# Patient Record
Sex: Female | Born: 1985 | Race: Black or African American | Hispanic: No | Marital: Single | Smoking: Never smoker
Health system: Southern US, Community
[De-identification: ages and names within clinical notes are randomized; demographics above are authoritative.]

## PROBLEM LIST (undated history)

## (undated) DIAGNOSIS — M797 Fibromyalgia: Secondary | ICD-10-CM

## (undated) DIAGNOSIS — F32A Depression, unspecified: Secondary | ICD-10-CM

## (undated) DIAGNOSIS — Z227 Latent tuberculosis: Secondary | ICD-10-CM

## (undated) DIAGNOSIS — F329 Major depressive disorder, single episode, unspecified: Secondary | ICD-10-CM

## (undated) DIAGNOSIS — G932 Benign intracranial hypertension: Secondary | ICD-10-CM

## (undated) DIAGNOSIS — G35 Multiple sclerosis: Secondary | ICD-10-CM

## (undated) DIAGNOSIS — G43709 Chronic migraine without aura, not intractable, without status migrainosus: Secondary | ICD-10-CM

## (undated) DIAGNOSIS — G43909 Migraine, unspecified, not intractable, without status migrainosus: Secondary | ICD-10-CM

## (undated) DIAGNOSIS — W19XXXA Unspecified fall, initial encounter: Secondary | ICD-10-CM

## (undated) HISTORY — DX: Latent tuberculosis: Z22.7

## (undated) HISTORY — DX: Fibromyalgia: M79.7

## (undated) HISTORY — PX: OTHER SURGICAL HISTORY: SHX169

## (undated) HISTORY — DX: Migraine, unspecified, not intractable, without status migrainosus: G43.909

## (undated) HISTORY — DX: Unspecified fall, initial encounter: W19.XXXA

## (undated) HISTORY — DX: Chronic migraine without aura, not intractable, without status migrainosus: G43.709

## (undated) HISTORY — DX: Benign intracranial hypertension: G93.2

---

## 2014-02-15 ENCOUNTER — Encounter (HOSPITAL_COMMUNITY): Payer: Self-pay | Admitting: Emergency Medicine

## 2014-02-15 ENCOUNTER — Emergency Department (HOSPITAL_COMMUNITY)
Admission: EM | Admit: 2014-02-15 | Discharge: 2014-02-16 | Disposition: A | Discharge status: Home / Self Care | Payer: Medicare (Managed Care) | Attending: Emergency Medicine | Admitting: Emergency Medicine

## 2014-02-15 DIAGNOSIS — F1310 Sedative, hypnotic or anxiolytic abuse, uncomplicated: Secondary | ICD-10-CM | POA: Diagnosis not present

## 2014-02-15 DIAGNOSIS — R45851 Suicidal ideations: Secondary | ICD-10-CM | POA: Insufficient documentation

## 2014-02-15 DIAGNOSIS — Z3202 Encounter for pregnancy test, result negative: Secondary | ICD-10-CM | POA: Insufficient documentation

## 2014-02-15 DIAGNOSIS — F1210 Cannabis abuse, uncomplicated: Secondary | ICD-10-CM | POA: Insufficient documentation

## 2014-02-15 DIAGNOSIS — F3289 Other specified depressive episodes: Secondary | ICD-10-CM | POA: Insufficient documentation

## 2014-02-15 DIAGNOSIS — F32A Depression, unspecified: Secondary | ICD-10-CM

## 2014-02-15 DIAGNOSIS — Z8669 Personal history of other diseases of the nervous system and sense organs: Secondary | ICD-10-CM | POA: Insufficient documentation

## 2014-02-15 DIAGNOSIS — Z008 Encounter for other general examination: Secondary | ICD-10-CM | POA: Diagnosis present

## 2014-02-15 DIAGNOSIS — F329 Major depressive disorder, single episode, unspecified: Secondary | ICD-10-CM | POA: Diagnosis not present

## 2014-02-15 HISTORY — DX: Major depressive disorder, single episode, unspecified: F32.9

## 2014-02-15 HISTORY — DX: Multiple sclerosis: G35

## 2014-02-15 HISTORY — DX: Depression, unspecified: F32.A

## 2014-02-15 LAB — COMPREHENSIVE METABOLIC PANEL
ALBUMIN: 4 g/dL (ref 3.5–5.2)
ALK PHOS: 62 U/L (ref 39–117)
ALT: 16 U/L (ref 0–35)
AST: 24 U/L (ref 0–37)
Anion gap: 15 (ref 5–15)
BUN: 7 mg/dL (ref 6–23)
CHLORIDE: 101 meq/L (ref 96–112)
CO2: 25 mEq/L (ref 19–32)
Calcium: 9.3 mg/dL (ref 8.4–10.5)
Creatinine, Ser: 0.63 mg/dL (ref 0.50–1.10)
GFR calc Af Amer: >90 mL/min (ref >90)
GFR calc non Af Amer: >90 mL/min (ref >90)
Glucose, Bld: 92 mg/dL (ref 70–99)
POTASSIUM: 3.9 meq/L (ref 3.7–5.3)
Sodium: 141 mEq/L (ref 137–147)
TOTAL PROTEIN: 7.7 g/dL (ref 6.0–8.3)
Total Bilirubin: 0.3 mg/dL (ref 0.3–1.2)

## 2014-02-15 LAB — RAPID URINE DRUG SCREEN, HOSP PERFORMED
Amphetamines: NOT DETECTED
BENZODIAZEPINES: NOT DETECTED
Barbiturates: POSITIVE — AB
COCAINE: NOT DETECTED
Opiates: NOT DETECTED
TETRAHYDROCANNABINOL: POSITIVE — AB

## 2014-02-15 LAB — CBC
HEMATOCRIT: 38.7 % (ref 36.0–46.0)
Hemoglobin: 12.8 g/dL (ref 12.0–15.0)
MCH: 29.2 pg (ref 26.0–34.0)
MCHC: 33.1 g/dL (ref 30.0–36.0)
MCV: 88.2 fL (ref 78.0–100.0)
PLATELETS: 231 10*3/uL (ref 150–400)
RBC: 4.39 MIL/uL (ref 3.87–5.11)
RDW: 14 % (ref 11.5–15.5)
WBC: 8.6 10*3/uL (ref 4.0–10.5)

## 2014-02-15 LAB — ETHANOL

## 2014-02-15 LAB — SALICYLATE LEVEL: Salicylate Lvl: <2 mg/dL — ABNORMAL LOW (ref 2.8–20.0)

## 2014-02-15 LAB — PREGNANCY, URINE: Preg Test, Ur: NEGATIVE

## 2014-02-15 LAB — ACETAMINOPHEN LEVEL

## 2014-02-15 MED ORDER — VITAMIN B-6 100 MG PO TABS
100.0000 mg | ORAL_TABLET | Freq: Every day | ORAL | Status: DC
  Administered 2014-02-15: 100 mg via ORAL
  Filled 2014-02-15 (×2): qty 1

## 2014-02-15 MED ORDER — LORATADINE 10 MG PO TABS
10.0000 mg | ORAL_TABLET | Freq: Every day | ORAL | Status: DC
  Filled 2014-02-15: qty 1

## 2014-02-15 MED ORDER — ZOLPIDEM TARTRATE 5 MG PO TABS
5.0000 mg | ORAL_TABLET | Freq: Every day | ORAL | Status: DC
  Administered 2014-02-15: 5 mg via ORAL
  Filled 2014-02-15: qty 1

## 2014-02-15 MED ORDER — PRENATAL 27-0.8 MG PO TABS: 1.0000 | ORAL_TABLET | Freq: Every morning | ORAL | Status: DC

## 2014-02-15 MED ORDER — ISONIAZID 300 MG PO TABS
300.0000 mg | ORAL_TABLET | Freq: Every day | ORAL | Status: DC
  Administered 2014-02-15: 300 mg via ORAL
  Filled 2014-02-15 (×2): qty 1

## 2014-02-15 MED ORDER — DRONABINOL 5 MG PO CAPS: 5.0000 mg | ORAL_CAPSULE | Freq: Every day | ORAL | Status: DC

## 2014-02-15 MED ORDER — VITAMIN D3 25 MCG (1000 UNIT) PO TABS
2000.0000 [IU] | ORAL_TABLET | Freq: Every day | ORAL | Status: DC
  Administered 2014-02-15: 2000 [IU] via ORAL
  Filled 2014-02-15: qty 2

## 2014-02-15 MED ORDER — CITALOPRAM HYDROBROMIDE 10 MG PO TABS: 10.0000 mg | ORAL_TABLET | Freq: Every day | ORAL | Status: DC

## 2014-02-15 MED ORDER — DULOXETINE HCL 60 MG PO CPEP
60.0000 mg | ORAL_CAPSULE | Freq: Every day | ORAL | Status: DC
  Administered 2014-02-15: 60 mg via ORAL
  Filled 2014-02-15: qty 1

## 2014-02-15 MED ORDER — VITAMIN D 50 MCG (2000 UT) PO CAPS: 2000.0000 [IU] | ORAL_CAPSULE | Freq: Every day | ORAL | Status: DC

## 2014-02-15 MED ORDER — OXCARBAZEPINE 150 MG PO TABS
150.0000 mg | ORAL_TABLET | Freq: Every day | ORAL | Status: DC
  Administered 2014-02-15: 150 mg via ORAL
  Filled 2014-02-15 (×2): qty 1

## 2014-02-15 NOTE — ED Notes (Signed)
All of pts belongings sent with pt mother.

## 2014-02-15 NOTE — ED Notes (Signed)
Pt c/o SI without plan x 1 month; pt sts increased depression and life stress

## 2014-02-15 NOTE — ED Provider Notes (Signed)
CSN: [id number]     Arrival date & time 02/15/14  1503 History   First MD Initiated Contact with Patient 02/15/14 1623     Chief Complaint  Patient presents with  . Medical Clearance  . Suicidal     (Consider location/radiation/quality/duration/timing/severity/associated sxs/prior Treatment) HPI Comments: Patient presents to the emergency department with chief complaint of suicidal ideation. She states that she has a history of depression, MS, and fibromyalgia. She states that she was sent to the emergency department by her primary care provider for suicidal thoughts. She states that she has been having the thoughts for the past month. She denies a specific plan of how to kill herself. She denies any HI. Denies any recreational drug use. Denies any alcohol use. There no aggravating or relieving factors. Patient is concerned the thoughts are coming from a recent medication switched to Cymbalta.  The history is provided by the patient. No language interpreter was used.    Past Medical History  Diagnosis Date  . Depression   . Multiple sclerosis    History reviewed. No pertinent past surgical history. History reviewed. No pertinent family history. History  Substance Use Topics  . Smoking status: Never Smoker   . Smokeless tobacco: Not on file  . Alcohol Use: Yes     Comment: occ   OB History   Grav Para Term Preterm Abortions TAB SAB Ect Mult Living                 Review of Systems  Constitutional: Negative for fever and chills.  Respiratory: Negative for shortness of breath.   Cardiovascular: Negative for chest pain.  Gastrointestinal: Negative for nausea, vomiting, diarrhea and constipation.  Genitourinary: Negative for dysuria.  All other systems reviewed and are negative.     Allergies  Review of patient's allergies indicates not on file.  Home Medications   Prior to Admission medications   Not on File   BP 111/8  Pulse 90  Temp(Src) 97.8 F (36.6 C) (Oral)   Resp 18  SpO2 99% Physical Exam  Nursing note and vitals reviewed. Constitutional: She is oriented to person, place, and time. She appears well-developed and well-nourished.  HENT:  Head: Normocephalic and atraumatic.  Eyes: Conjunctivae and EOM are normal. Pupils are equal, round, and reactive to light.  Neck: Normal range of motion. Neck supple.  Cardiovascular: Normal rate and regular rhythm.  Exam reveals no gallop and no friction rub.   No murmur heard. Pulmonary/Chest: Effort normal and breath sounds normal. No respiratory distress. She has no wheezes. She has no rales. She exhibits no tenderness.  Abdominal: Soft. She exhibits no distension and no mass. There is no tenderness. There is no rebound and no guarding.  Musculoskeletal: Normal range of motion. She exhibits no edema and no tenderness.  Neurological: She is alert and oriented to person, place, and time.  Skin: Skin is warm and dry.  Psychiatric: She has a normal mood and affect. Her behavior is normal. Judgment and thought content normal.    ED Course  Procedures (including critical care time) Results for orders placed during the hospital encounter of 02/15/14  ACETAMINOPHEN LEVEL      Result Value Ref Range   Acetaminophen (Tylenol), Serum <15.0  10 - 30 ug/mL  CBC      Result Value Ref Range   WBC 8.6  4.0 - 10.5 K/uL   RBC 4.39  3.87 - 5.11 MIL/uL   Hemoglobin 12.8  12.0 - 15.0 g/dL  HCT 38.7  36.0 - 46.0 %   MCV 88.2  78.0 - 100.0 fL   MCH 29.2  26.0 - 34.0 pg   MCHC 33.1  30.0 - 36.0 g/dL   RDW 16.1  09.6 - 04.5 %   Platelets 231  150 - 400 K/uL  COMPREHENSIVE METABOLIC PANEL      Result Value Ref Range   Sodium 141  137 - 147 mEq/L   Potassium 3.9  3.7 - 5.3 mEq/L   Chloride 101  96 - 112 mEq/L   CO2 25  19 - 32 mEq/L   Glucose, Bld 92  70 - 99 mg/dL   BUN 7  6 - 23 mg/dL   Creatinine, Ser 4.09  0.50 - 1.10 mg/dL   Calcium 9.3  8.4 - 81.1 mg/dL   Total Protein 7.7  6.0 - 8.3 g/dL   Albumin 4.0   3.5 - 5.2 g/dL   AST 24  0 - 37 U/L   ALT 16  0 - 35 U/L   Alkaline Phosphatase 62  39 - 117 U/L   Total Bilirubin 0.3  0.3 - 1.2 mg/dL   GFR calc non Af Amer >90  >90 mL/min   GFR calc Af Amer >90  >90 mL/min   Anion gap 15  5 - 15  ETHANOL      Result Value Ref Range   Alcohol, Ethyl (B) <11  0 - 11 mg/dL  SALICYLATE LEVEL      Result Value Ref Range   Salicylate Lvl <2.0 (*) 2.8 - 20.0 mg/dL  URINE RAPID DRUG SCREEN (HOSP PERFORMED)      Result Value Ref Range   Opiates NONE DETECTED  NONE DETECTED   Cocaine NONE DETECTED  NONE DETECTED   Benzodiazepines NONE DETECTED  NONE DETECTED   Amphetamines NONE DETECTED  NONE DETECTED   Tetrahydrocannabinol POSITIVE (*) NONE DETECTED   Barbiturates POSITIVE (*) NONE DETECTED  PREGNANCY, URINE      Result Value Ref Range   Preg Test, Ur NEGATIVE  NEGATIVE   No results found.    EKG Interpretation None      MDM   Final diagnoses:  None    Patient with suicidal ideation. No clear plan today. TTS consult pending.  Will reassess after labs.  Medically clear pending normal labs.  Medically clear.  Filed Vitals:   02/15/14 1918  BP: 118/77  Pulse: 87  Temp: 98.3 F (36.8 C)  Resp: [address] [place] 02/15/14 2331

## 2014-02-15 NOTE — ED Notes (Signed)
Pt came to room with mom and did not freely answer questions when being assessed by RN.  Mother states she was recently diagnosed with MS, has cronic pain, recently had to leave her home in [place] and come to live with her mom due to not being able to adequately care for her 26 year old daughter and herself.  Pt mom states she told her she was depressed and having "bad thoughts" and admitting to mom she thought of hurting herself (no plan.)

## 2014-02-16 NOTE — Discharge Instructions (Signed)
Depression Frances Dudley, you were seen for depression.  You were given resources by our psychiatry team to follow up with.  If any of your symptoms worsen, come back to the ED immediately for repeat evaluation.  Thank you. Depression is feeling sad, low, down in the dumps, blue, gloomy, or empty. In general, there are two kinds of depression:  Normal sadness or grief. This can happen after something upsetting. It often goes away on its own within 2 weeks. After losing a loved one (bereavement), normal sadness and grief may last longer than two weeks. It usually gets better with time.  Clinical depression. This kind lasts longer than normal sadness or grief. It keeps you from doing the things you normally do in life. It is often hard to function at home, work, or at school. It may affect your relationships with others. Treatment is often needed. GET HELP RIGHT AWAY IF:  You have thoughts about hurting yourself or others.  You lose touch with reality (psychotic symptoms). You may:  See or hear things that are not real.  Have untrue beliefs about your life or people around you.  Your medicine is giving you problems. MAKE SURE YOU:  Understand these instructions.  Will watch your condition.  Will get help right away if you are not doing well or get worse. Document Released: 06/16/2010 Document Revised: 09/28/2013 Document Reviewed: 09/13/2011 [facility] Patient Information 2015 [place], [place]. This information is not intended to replace advice given to you by your health care provider. Make sure you discuss any questions you have with your health care provider.

## 2014-02-16 NOTE — BH Assessment (Addendum)
Tele Assessment Note   Frances Dudley is an 28 y.o. female.  -Clinician talked to Frances Horseman, PA at [facility].  He said that Dr. Hollace Dudley (PCP) had recommended that she come to [name] because of some SI.  Patient says that she has some SI.  A few days ago she had thoughts about wanting to step into traffic after her car broke down.  Pt says that the SI is "on and off."  Patient has no previous attempt to kill self.  Pt denies HI or A/V hallucinations.  Pt takes celexa which is prescribed by primary care physician and cymbalta is prescribed by neurologist.  Cymbalta was recently adjusted.    Pt was diagnosed with MS in April of this year.  Fibromyalgia was dx/ed in February or so.  Patient also recently moved from [place] after bringing her Frances Dudley (28 years old) to move in with her parents.  She had been upset about her car breaking down when she had thoughts about wanting to kill herself by stepping into traffic.  Patient does have depression and does want some help.  Patient said that she is able to contract for safety.  She is interested in information on the [facility] intensive outpatient care.  -Clinician talked to Frances Sievert, PA regarding patient care.  He said that if patient is able to contract for safety then she could be given outpatient referrals.  Patient care discussed also with Dr. Mora Dudley who was in agreement with patient signing no harm contract and getting referral list.  Clinician did send a no harm contract, information on intensive outpatient, and referral list to nurse in pod C.  Pt will be discharged home with referrals.  Axis I: Depressive Disorder NOS Axis II: Deferred Axis III:  Past Medical History  Diagnosis Date  . Depression   . Multiple sclerosis    Axis IV: other psychosocial or environmental problems Axis V: 41-50 serious symptoms  Past Medical History:  Past Medical History  Diagnosis Date  . Depression   . Multiple sclerosis     History reviewed. No  pertinent past surgical history.  Family History: History reviewed. No pertinent family history.  Social History:  reports that she has never smoked. She does not have any smokeless tobacco history on file. She reports that she drinks alcohol. She reports that she does not use illicit drugs.  Additional Social History:  Alcohol / Drug Use Pain Medications: See PTA medication list.  Marinol for pain relief. Prescriptions: See PTA medication list  Over the Counter: See PTA medication list. History of alcohol / drug use?: No history of alcohol / drug abuse  CIWA: CIWA-Ar BP: 118/77 mmHg Pulse Rate: 87 COWS:    PATIENT STRENGTHS: (choose at least two) Ability for insight Supportive family/friends  Allergies:  Allergies  Allergen Reactions  . Sumatriptan Nausea And Vomiting and Other (See Comments)    lockjaw    Home Medications:  (Not in a hospital admission)  OB/GYN Status:  No LMP recorded.  General Assessment Data Location of Assessment: [facility] ED Is this a Tele or Face-to-Face Assessment?: Face-to-Face Is this an Initial Assessment or a Re-assessment for this encounter?: Initial Assessment Living Arrangements: Parent (Pt lives with mother, her Frances Dudley and her grandparents) Can pt return to current living arrangement?: Yes Admission Status: Voluntary Is patient capable of signing voluntary admission?: Yes Transfer from: Acute Hospital Referral Source: MD (Dr. 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  Risk to self with the past 6 months Suicidal Ideation: Yes-Currently Present Suicidal Intent: Yes-Currently Present Is patient at risk for suicide?: Yes Suicidal Plan?: No  (Recently had thoughts of stepping into traffic.) Access to Means: Yes Specify Access to Suicidal Means: Traffic What has been your use of drugs/alcohol within the last 12 months?: None Previous Attempts/Gestures: No How many times?: 0 Other Self Harm Risks: N/A Triggers for Past Attempts: None known Intentional Self Injurious Behavior: None Family Suicide History: Yes (Brother committed suicide 2 years ago.) Recent stressful life event(s): Job Loss;Turmoil (Comment);Recent negative physical changes (Pt has MS, fibromyalgia.  Recent move, Health is poor.) Persecutory voices/beliefs?: Yes Depression: Yes Depression Symptoms: Despondent;Insomnia;Tearfulness;Isolating;Guilt;Loss of interest in usual pleasures;Feeling worthless/self pity Substance abuse history and/or treatment for substance abuse?: No Suicide prevention information given to non-admitted patients: Not applicable  Risk to Others within the past 6 months Homicidal Ideation: No Thoughts of Harm to Others: No Current Homicidal Intent: No Current Homicidal Plan: No Access to Homicidal Means: No Identified Victim: No one History of harm to others?: No Assessment of Violence: None Noted Violent Behavior Description: None noted Does patient have access to weapons?: No Criminal Charges Pending?: No Does patient have a court date: No  Psychosis Hallucinations: None noted Delusions: None noted  Mental Status Report Appear/Hygiene: Unremarkable;In scrubs Eye Contact: Fair Motor Activity: Freedom of movement;Unremarkable Speech: Logical/coherent Level of Consciousness: Alert Mood: Depressed;Empty;Helpless;Sad Affect: Appropriate to circumstance;Sad Anxiety Level: Minimal Thought Processes: Coherent;Relevant Judgement: Unimpaired Orientation: Person;Place;Situation Obsessive Compulsive Thoughts/Behaviors: Minimal  Cognitive Functioning Concentration: Decreased Memory: Recent Impaired;Remote Intact IQ:  Average Insight: Fair Impulse Control: Fair Appetite: Poor Weight Loss: 0 Weight Gain: 0 Sleep: Decreased Total Hours of Sleep:  (<4H/D) Vegetative Symptoms: Decreased grooming;Staying in bed  ADLScreening [facility] Assessment Services) Patient's cognitive ability adequate to safely complete daily activities?: Yes Patient able to express need for assistance with ADLs?: Yes Independently performs ADLs?: Yes (appropriate for developmental age)  Prior Inpatient Therapy Prior Inpatient Therapy: No Prior Therapy Dates: N/A Prior Therapy Facilty/Provider(s): N/A Reason for Treatment: N/A  Prior Outpatient Therapy Prior Outpatient Therapy: No Prior Therapy Dates: N/A Prior Therapy Facilty/Provider(s): N/A Reason for Treatment: N/A  ADL Screening (condition at time of admission) Patient's cognitive ability adequate to safely complete daily activities?: Yes Is the patient deaf or have difficulty hearing?: No Does the patient have difficulty seeing, even when wearing glasses/contacts?: Yes (Pt states that vision is getting worse.  She has contacts.) Does the patient have difficulty concentrating, remembering, or making decisions?: Yes Patient able to express need for assistance with ADLs?: Yes Does the patient have difficulty dressing or bathing?: No Independently performs ADLs?: Yes (appropriate for developmental age) Does the patient have difficulty walking or climbing stairs?: Yes (Pt says legs will give out or feel weak.) Weakness of Legs: Both Weakness of Arms/Hands: Both (Arms will feel achy or weak at times.)       Abuse/Neglect Assessment (Assessment to be complete while patient is alone) Physical Abuse: Denies Verbal Abuse: Denies Sexual Abuse: Denies Exploitation of patient/patient's resources: Denies Self-Neglect: Denies Values / Beliefs Cultural Requests During Hospitalization: None Spiritual Requests During Hospitalization: None   Advance Directives (For  Healthcare) Does patient have an advance directive?: No Would patient like information on creating an  advanced directive?: No - patient declined information    Additional Information 1:1 In Past 12 Months?: No CIRT Risk: No Elopement Risk: No Does patient have medical clearance?: Yes     Disposition:  Disposition Initial Assessment Completed for this Encounter: Yes Disposition of Patient: Inpatient treatment program;Referred to Type of inpatient treatment program: Adult Patient referred to: Other (Comment) (Pt to be reviewed by Karleen Hampshire.)  Beatriz Stallion Ray 02/16/2014 1:01 AM

## 2014-02-20 NOTE — ED Provider Notes (Signed)
Medical screening examination/treatment/procedure(s) were performed by non-physician practitioner and as supervising physician I was immediately available for consultation/collaboration.   EKG Interpretation None        Mirian Mo, MD 02/20/14 [id number]

## 2014-04-06 ENCOUNTER — Ambulatory Visit (INDEPENDENT_AMBULATORY_CARE_PROVIDER_SITE_OTHER): Payer: Medicaid Other | Admitting: Family Medicine

## 2014-04-06 ENCOUNTER — Telehealth: Payer: Self-pay | Admitting: Family Medicine

## 2014-04-06 VITALS — BP 100/63 | HR 71 | Temp 97.8°F | Resp 16 | Ht 62.0 in | Wt 212.0 lb

## 2014-04-06 DIAGNOSIS — R5383 Other fatigue: Secondary | ICD-10-CM

## 2014-04-06 DIAGNOSIS — R42 Dizziness and giddiness: Secondary | ICD-10-CM | POA: Diagnosis not present

## 2014-04-06 DIAGNOSIS — F418 Other specified anxiety disorders: Secondary | ICD-10-CM

## 2014-04-06 DIAGNOSIS — R52 Pain, unspecified: Secondary | ICD-10-CM | POA: Insufficient documentation

## 2014-04-06 DIAGNOSIS — Z227 Latent tuberculosis: Secondary | ICD-10-CM | POA: Insufficient documentation

## 2014-04-06 DIAGNOSIS — F32A Depression, unspecified: Secondary | ICD-10-CM

## 2014-04-06 DIAGNOSIS — Z91048 Other nonmedicinal substance allergy status: Secondary | ICD-10-CM | POA: Diagnosis not present

## 2014-04-06 DIAGNOSIS — R635 Abnormal weight gain: Secondary | ICD-10-CM

## 2014-04-06 DIAGNOSIS — R7611 Nonspecific reaction to tuberculin skin test without active tuberculosis: Secondary | ICD-10-CM

## 2014-04-06 DIAGNOSIS — F419 Anxiety disorder, unspecified: Secondary | ICD-10-CM

## 2014-04-06 DIAGNOSIS — R110 Nausea: Secondary | ICD-10-CM | POA: Insufficient documentation

## 2014-04-06 DIAGNOSIS — E559 Vitamin D deficiency, unspecified: Secondary | ICD-10-CM

## 2014-04-06 DIAGNOSIS — Z9109 Other allergy status, other than to drugs and biological substances: Secondary | ICD-10-CM | POA: Insufficient documentation

## 2014-04-06 DIAGNOSIS — F329 Major depressive disorder, single episode, unspecified: Secondary | ICD-10-CM

## 2014-04-06 DIAGNOSIS — Z8669 Personal history of other diseases of the nervous system and sense organs: Secondary | ICD-10-CM

## 2014-04-06 DIAGNOSIS — G35 Multiple sclerosis: Secondary | ICD-10-CM | POA: Diagnosis not present

## 2014-04-06 DIAGNOSIS — G4700 Insomnia, unspecified: Secondary | ICD-10-CM

## 2014-04-06 DIAGNOSIS — R55 Syncope and collapse: Secondary | ICD-10-CM | POA: Diagnosis not present

## 2014-04-06 LAB — CBC WITH DIFFERENTIAL/PLATELET
BASOS PCT: 1 % (ref 0–1)
Basophils Absolute: 0.1 10*3/uL (ref 0.0–0.1)
EOS ABS: 0.2 10*3/uL (ref 0.0–0.7)
EOS PCT: 3 % (ref 0–5)
HEMATOCRIT: 39.3 % (ref 36.0–46.0)
HEMOGLOBIN: 13 g/dL (ref 12.0–15.0)
Lymphocytes Relative: 50 % — ABNORMAL HIGH (ref 12–46)
Lymphs Abs: 4.1 10*3/uL — ABNORMAL HIGH (ref 0.7–4.0)
MCH: 28.3 pg (ref 26.0–34.0)
MCHC: 33.1 g/dL (ref 30.0–36.0)
MCV: 85.6 fL (ref 78.0–100.0)
MONOS PCT: 8 % (ref 3–12)
Monocytes Absolute: 0.6 10*3/uL (ref 0.1–1.0)
Neutro Abs: 3.1 10*3/uL (ref 1.7–7.7)
Neutrophils Relative %: 38 % — ABNORMAL LOW (ref 43–77)
Platelets: 270 10*3/uL (ref 150–400)
RBC: 4.59 MIL/uL (ref 3.87–5.11)
RDW: 15 % (ref 11.5–15.5)
WBC: 8.1 10*3/uL (ref 4.0–10.5)

## 2014-04-06 LAB — COMPLETE METABOLIC PANEL WITH GFR
ALT: 12 U/L (ref 0–35)
AST: 19 U/L (ref 0–37)
Albumin: 4.2 g/dL (ref 3.5–5.2)
Alkaline Phosphatase: 52 U/L (ref 39–117)
BILIRUBIN TOTAL: 0.4 mg/dL (ref 0.2–1.2)
BUN: 8 mg/dL (ref 6–23)
CALCIUM: 9.4 mg/dL (ref 8.4–10.5)
CO2: 26 mEq/L (ref 19–32)
CREATININE: 0.68 mg/dL (ref 0.50–1.10)
Chloride: 101 mEq/L (ref 96–112)
GFR, Est Non African American: >89 mL/min
Glucose, Bld: 83 mg/dL (ref 70–99)
Potassium: 3.8 mEq/L (ref 3.5–5.3)
Sodium: 138 mEq/L (ref 135–145)
Total Protein: 6.9 g/dL (ref 6.0–8.3)

## 2014-04-06 LAB — TSH: TSH: 1.3 u[IU]/mL (ref 0.350–4.500)

## 2014-04-06 MED ORDER — ZOLPIDEM TARTRATE 5 MG PO TABS: 5.0000 mg | ORAL_TABLET | Freq: Every day | ORAL | Status: DC

## 2014-04-06 MED ORDER — LORATADINE 10 MG PO TABS: 10.0000 mg | ORAL_TABLET | Freq: Every day | ORAL | Status: AC

## 2014-04-06 NOTE — Patient Instructions (Addendum)
Near-Syncope Near-syncope (commonly known as near fainting) is sudden weakness, dizziness, or feeling like you might pass out. During an episode of near-syncope, you may also develop pale skin, have tunnel vision, or feel sick to your stomach (nauseous). Near-syncope may occur when getting up after sitting or while standing for a long time. It is caused by a sudden decrease in blood flow to the brain. This decrease can result from various causes or triggers, most of which are not serious. However, because near-syncope can sometimes be a sign of something serious, a medical evaluation is required. The specific cause is often not determined. HOME CARE INSTRUCTIONS  Monitor your condition for any changes. The following actions may help to alleviate any discomfort you are experiencing:  Have someone stay with you until you feel stable.  Lie down right away and prop your feet up if you start feeling like you might faint. Breathe deeply and steadily. Wait until all the symptoms have passed. Most of these episodes last only a few minutes. You may feel tired for several hours.   Drink enough fluids to keep your urine clear or pale yellow.   If you are taking blood pressure or heart medicine, get up slowly when seated or lying down. Take several minutes to sit and then stand. This can reduce dizziness.  Follow up with your health care provider as directed. SEEK IMMEDIATE MEDICAL CARE IF:   You have a severe headache.   You have unusual pain in the chest, abdomen, or back.   You are bleeding from the mouth or rectum, or you have black or tarry stool.   You have an irregular or very fast heartbeat.   You have repeated fainting or have seizure-like jerking during an episode.   You faint when sitting or lying down.   You have confusion.   You have difficulty walking.   You have severe weakness.   You have vision problems.  MAKE SURE YOU:   Understand these instructions.  Will  watch your condition.  Will get help right away if you are not doing well or get worse. Document Released: 05/14/2005 Document Revised: 05/19/2013 Document Reviewed: 10/17/2012 [facility] Patient Information 2015 [place]. This information is not intended to replace advice given to you by your health care provider. Make sure you discuss any questions you have with your health care provider. Insomnia Insomnia is frequent trouble falling and/or staying asleep. Insomnia can be a long term problem or a short term problem. Both are common. Insomnia can be a short term problem when the wakefulness is related to a certain stress or worry. Long term insomnia is often related to ongoing stress during waking hours and/or poor sleeping habits. Overtime, sleep deprivation itself can make the problem worse. Every little thing feels more severe because you are overtired and your ability to cope is decreased. CAUSES   Stress, anxiety, and depression.  Poor sleeping habits.  Distractions such as TV in the bedroom.  Naps close to bedtime.  Engaging in emotionally charged conversations before bed.  Technical reading before sleep.  Alcohol and other sedatives. They may make the problem worse. They can hurt normal sleep patterns and normal dream activity.  Stimulants such as caffeine for several hours prior to bedtime.  Pain syndromes and shortness of breath can cause insomnia.  Exercise late at night.  Changing time zones may cause sleeping problems (jet lag). It is sometimes helpful to have someone observe your sleeping patterns. They should look for periods  of not breathing during the night (sleep apnea). They should also look to see how long those periods last. If you live alone or observers are uncertain, you can also be observed at a sleep clinic where your sleep patterns will be professionally monitored. Sleep apnea requires a checkup and treatment. Give your caregivers your medical history.  Give your caregivers observations your family has made about your sleep.  SYMPTOMS   Not feeling rested in the morning.  Anxiety and restlessness at bedtime.  Difficulty falling and staying asleep. TREATMENT   Your caregiver may prescribe treatment for an underlying medical disorders. Your caregiver can give advice or help if you are using alcohol or other drugs for self-medication. Treatment of underlying problems will usually eliminate insomnia problems.  Medications can be prescribed for short time use. They are generally not recommended for lengthy use.  Over-the-counter sleep medicines are not recommended for lengthy use. They can be habit forming.  You can promote easier sleeping by making lifestyle changes such as:  Using relaxation techniques that help with breathing and reduce muscle tension.  Exercising earlier in the day.  Changing your diet and the time of your last meal. No night time snacks.  Establish a regular time to go to bed.  Counseling can help with stressful problems and worry.  Soothing music and white noise may be helpful if there are background noises you cannot remove.  Stop tedious detailed work at least one hour before bedtime. HOME CARE INSTRUCTIONS   Keep a diary. Inform your caregiver about your progress. This includes any medication side effects. See your caregiver regularly. Take note of:  Times when you are asleep.  Times when you are awake during the night.  The quality of your sleep.  How you feel the next day. This information will help your caregiver care for you.  Get out of bed if you are still awake after 15 minutes. Read or do some quiet activity. Keep the lights down. Wait until you feel sleepy and go back to bed.  Keep regular sleeping and waking hours. Avoid naps.  Exercise regularly.  Avoid distractions at bedtime. Distractions include watching television or engaging in any intense or detailed activity like attempting to  balance the household checkbook.  Develop a bedtime ritual. Keep a familiar routine of bathing, brushing your teeth, climbing into bed at the same time each night, listening to soothing music. Routines increase the success of falling to sleep faster.  Use relaxation techniques. This can be using breathing and muscle tension release routines. It can also include visualizing peaceful scenes. You can also help control troubling or intruding thoughts by keeping your mind occupied with boring or repetitive thoughts like the old concept of counting sheep. You can make it more creative like imagining planting one beautiful flower after another in your backyard garden.  During your day, work to eliminate stress. When this is not possible use some of the previous suggestions to help reduce the anxiety that accompanies stressful situations. MAKE SURE YOU:   Understand these instructions.  Will watch your condition.  Will get help right away if you are not doing well or get worse. Document Released: 05/11/2000 Document Revised: 08/06/2011 Document Reviewed: 06/11/2007 [facility] Patient Information 2015 [place], [place]. This information is not intended to replace advice given to you by your health care provider. Make sure you discuss any questions you have with your health care provider.

## 2014-04-06 NOTE — Telephone Encounter (Signed)
Called the [facility] [place] on 04/06/2014 in regards to re-starting Tysabri therapy for multiple sclerosis. Faxed a request for medical records to [phone]. I was unable to speak with Dr. Rocky MorelBen Thrower or his nurse pertaining to current dose of Tysabri.

## 2014-04-06 NOTE — Progress Notes (Signed)
Subjective:    Patient ID: Frances Dudley, female    DOB: 04/23/1986, 28 y.o.   MRN: [id number]  HPI  Ms. Ericka PontiffMontgomery is a 28 year old female with a history of multiple sclerosis and depression that presents to establish care accompanied by her mother. She states that she recently relocated back to [place] from [place]. While in [place], she was followed by Dr. Rocky MorelBen Thrower at the [facility] for MS. Patient was also followed by pain management for generalized pain. Symptoms  of MS have been wide spread pain, syncope, dizziness, numbness to extremities, and frequent migraine headaches.  Onset of symptoms was gradual, starting about 8 months ago. Symptoms are currently of generalized severity. Symptoms occur all day.  Patient complains of depression. She complains of depressed mood, difficulty concentrating, fatigue, impaired memory, insomnia and weight gain. Onset was approximately 8 months ago, clinical course has worsened since that time. She denies current suicidal and homicidal plan or intent. She reports that she is adopted, so she is unsure whether there is a family history of depression. She reports that she was recently evaluated in the [facility] ED for suicidal thoughts. She reports that she had thoughts of walking in traffic. Symptoms have improved since discontining Cymbalta. She has been started on Celexa, which is working well per patient.    Past Medical History  Diagnosis Date  . Depression   . Multiple sclerosis    Review of Systems  Constitutional: Positive for fatigue and unexpected weight change (weight gain).  HENT: Negative.   Eyes: Negative.   Respiratory: Negative.   Cardiovascular: Negative.   Gastrointestinal: Negative.   Endocrine: Negative.   Genitourinary: Negative.   Musculoskeletal: Negative.   Skin: Negative.   Allergic/Immunologic: Positive for environmental allergies.  Neurological: Positive for dizziness, weakness and numbness.   Hematological: Negative.   Psychiatric/Behavioral: Positive for sleep disturbance. Negative for suicidal ideas. The patient is nervous/anxious.        Objective:   Physical Exam  Constitutional: She appears well-developed and well-nourished.  HENT:  Head: Normocephalic and atraumatic.  Right Ear: External ear normal.  Left Ear: External ear normal.  Mouth/Throat: Oropharynx is clear and moist.  Eyes: Conjunctivae and EOM are normal. Pupils are equal, round, and reactive to light. Right eye exhibits no nystagmus.  Neck: Trachea normal and normal range of motion. Neck supple. No thyroid mass and no thyromegaly present.  Cardiovascular: Normal rate, regular rhythm, normal heart sounds and normal pulses.   Pulmonary/Chest: Effort normal and breath sounds normal.  Abdominal: Soft. Normal appearance. There is no tenderness.  Lymphadenopathy:       Head (right side): No submental and no submandibular adenopathy present.       Head (left side): No submental and no submandibular adenopathy present.  Neurological: She is alert. She displays no atrophy and no tremor. No cranial nerve deficit or sensory deficit. She exhibits normal muscle tone.  Skin: Skin is warm, dry and intact.  Psychiatric: Her speech is normal and behavior is normal. Judgment and thought content normal. Her mood appears anxious. Cognition and memory are normal. She does not exhibit a depressed mood.     BP 100/63 mmHg  Pulse 71  Temp(Src) 97.8 F (36.6 C) (Oral)  Resp 16  Ht 5\' 2"  (1.575 m)  Wt 212 lb (96.163 kg)  BMI 38.77 kg/m2  LMP 03/31/2014 Assessment & Plan:    1.  Multiple sclerosis Patient was diagnosed Multiple sclerosis several months ago.  She has received 2 Tysabri treatments while living in [place], [place]. She started in July. Her last MRI of the brain was on 01/09/2014 She states that she missed her October Tysabri treatment. Patient has been going to a pain management center for generalized pain  related to MS. Will review records as they become available. Will need to refer to neurology and I will contact Dr. Drexel Iha to avoid delaying treatment.   - Ambulatory referral to Neurology  2. Anxiety and depression Patient reports that her depression and anxiety has increased since being diagnosed with MS on last April. She has also been very upset by having to move back in with her parents. She was evaluated in the psychiatric ED this past September for having suicidal ideations. She states that she was having thoughts of walking into traffic. She reports that suicidal ideations began after she was started on Cymbalta. She has since discontinued cymbalta and symptoms have improved. She was referred for counseling and continues Celexa daily.   3. History of migraine headaches She has been experiencing migraines since she was a teenager. She has a chronic daily headache. She did have botox injections previously by a neurologist, which were beneficial.  4. Nausea without vomiting Patient reports that she was seen in the ER at [facility] on 03/26/2014. She reports that she was started on Reglan for nausea with minimal relief. She states that she often becomes nauseous with migraine headaches.   5. Dizziness She reports that she is occasionally dizzy. She states that she has had dizziness since being diagnosed with MS. I suspect that occassional dizziness my be a medication side effect.   6. Generalized pain She reports that she was followed by Dr. Clelia Croft, at a pain clinic while in [place] that was also located at the [facility] for chronic pain related to fibromyalgia. She states that she was on Marinol and Trileptal for pain management.    7. Syncope, unspecified syncope type Orthostatic blood pressures within a normal limits. Reviewed CT of brain without contrast that was performed at [facility] ER on 03/25/2014. There were not intracranial abnormalities. Chest xray was also normal, with mild  scoliosis and EKG was NSR.  - Ambulatory referral to Neurology  8. Insomnia She reports that she has been staying up throughout the night watching television and sleeping during the day. She reports that her sleep regimen has been off since moving back to [place].  Recommend establishing a sleep routine.    - zolpidem (AMBIEN) 5 MG tablet; Take 1 tablet (5 mg total) by mouth at bedtime.  Dispense: 30 tablet; Refill: 0   9. Weight gain - TSH - Hemoglobin A1c - Urinalysis, Complet  10. Other fatigue - CBC with Differential - TSH  11. Vitamin D deficiency She has a history of vitamin D deficiency. She is currently taking 2000 units daily, I will check vitamin D level.  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 She reports that she taking Isoniazid 300 mg daily. She maintains that she had to complete 2 months of therapy prior to starting Tysabri treatments for MS. She states that she was started on therapy by infectious disease physician in [place], [place]. Will need to assess hepatic function. Will check CMP today. Will need to obtain medical records for the specified length of treatment.   - COMPLETE METABOLIC PANEL WITH GFR  13. Environmental allergies  - loratadine (CLARITIN) 10 MG tablet; Take  1 tablet (10 mg total) by mouth daily.  Dispense: 30 tablet; Refill: 2  Preventative  Abnormal periods:  Last pap smear: 6 years ago, will need a pap smear. Will follow up in 1 month for a CPE w/ pap Abnormal periods.  Sexually active  Massie Maroon, FNP

## 2014-04-07 LAB — URINALYSIS, COMPLETE
Bilirubin Urine: NEGATIVE
Casts: NONE SEEN
Crystals: NONE SEEN
GLUCOSE, UA: NEGATIVE mg/dL
HGB URINE DIPSTICK: NEGATIVE
KETONES UR: NEGATIVE mg/dL
Leukocytes, UA: NEGATIVE
NITRITE: NEGATIVE
Protein, ur: NEGATIVE mg/dL
Specific Gravity, Urine: 1.016 (ref 1.005–1.030)
Urobilinogen, UA: 0.2 mg/dL (ref 0.0–1.0)
pH: 6 (ref 5.0–8.0)

## 2014-04-07 LAB — HEMOGLOBIN A1C
Hgb A1c MFr Bld: 5.3 % (ref <5.7)
Mean Plasma Glucose: 105 mg/dL (ref <117)

## 2014-04-07 LAB — VITAMIN D 25 HYDROXY (VIT D DEFICIENCY, FRACTURES): Vit D, 25-Hydroxy: 63 ng/mL (ref 30–89)

## 2014-04-12 ENCOUNTER — Encounter: Payer: Self-pay | Admitting: Neurology

## 2014-04-12 ENCOUNTER — Ambulatory Visit (INDEPENDENT_AMBULATORY_CARE_PROVIDER_SITE_OTHER): Payer: Medicaid Other | Admitting: Neurology

## 2014-04-12 ENCOUNTER — Encounter: Payer: Self-pay | Admitting: Family Medicine

## 2014-04-12 VITALS — BP 109/75 | HR 78 | Ht 62.0 in | Wt 212.4 lb

## 2014-04-12 DIAGNOSIS — G43709 Chronic migraine without aura, not intractable, without status migrainosus: Secondary | ICD-10-CM

## 2014-04-12 DIAGNOSIS — G35 Multiple sclerosis: Secondary | ICD-10-CM

## 2014-04-12 DIAGNOSIS — M797 Fibromyalgia: Secondary | ICD-10-CM

## 2014-04-12 DIAGNOSIS — G43719 Chronic migraine without aura, intractable, without status migrainosus: Secondary | ICD-10-CM

## 2014-04-12 DIAGNOSIS — Z5181 Encounter for therapeutic drug level monitoring: Secondary | ICD-10-CM

## 2014-04-12 DIAGNOSIS — G35D Multiple sclerosis, unspecified: Secondary | ICD-10-CM

## 2014-04-12 HISTORY — DX: Fibromyalgia: M79.7

## 2014-04-12 HISTORY — DX: Chronic migraine without aura, not intractable, without status migrainosus: G43.709

## 2014-04-12 MED ORDER — ZONISAMIDE 50 MG PO CAPS: ORAL_CAPSULE | ORAL | Status: DC

## 2014-04-12 NOTE — Patient Instructions (Signed)

## 2014-04-12 NOTE — Progress Notes (Signed)
Reason for visit: multiple sclerosis, migraine  Frances Dudley is a 28 y.o. female  History of present illness:  Frances Dudley is a 28 year old right-handed black female with a history of chronic pain that began around 2008. The patient began to have some issues during pregnancy, and she developed discomfort in the back and down both legs and eventually had a slow progression of the pain to include the shoulders, neck, and arms.  The patient eventually underwent MRI evaluation of the brain and she also believes of the cervical spinal cord. She was found to have white matter abnormalities, and she was sent to an MS specialist, Dr. Drexel Ihahrower, in [place]. The patient underwent a lumbar puncture, and she was found to have abnormalities consistent with the diagnosis of multiple sclerosis. The patient was started on Tysabri which was begun in July 2015. The patient believes that she had a scan of the head in August 2015. She has moved back [place] the [place] area. She does not have any of her medical records with her. She has noted some problems with concentration and memory, some numbness and tingling in the hands and feet. The patient has some mild balance issues, occasional falls. The patient denies problems controlling the bowels or the bladder. The patient reports some blurring of vision associated with her migraine headaches.  The patient states that her migraine headaches began about one year ago, and now are daily in nature. The patient has pain behind the eyes that spreads to the back of the head. She may have some nausea without vomiting. She has no significant neck stiffness. She reports photophobia and phonophobia. The headaches are throughout the entire day. She has been on Topamax, propranolol, amitriptyline, and Cymbalta without benefit. She has taking Imitrex and Maxalt without benefit. She indicates that she has significant disability from the headaches, she is  unable to function on a daily basis. She comes to this office for further evaluation. She was started on Botox treatments in July 2015, she has had only one injection. She found that the injection was very painful. The patient was diagnosed with fibromyalgia. She indicates that she has had a sleep study to evaluate her headaches and fatigue issues.  Past Medical History  Diagnosis Date  . Depression   . Multiple sclerosis   . TB lung, latent   . Migraine     History reviewed. No pertinent past surgical history.  Family History  Problem Relation Age of Onset  . Adopted: Yes  . Fibromyalgia Mother     Social history:  reports that she has never smoked. She has never used smokeless tobacco. She reports that she drinks alcohol. She reports that she does not use illicit drugs.  Medications:  Current Outpatient Prescriptions on File Prior to Visit  Medication Sig Dispense Refill  . Cholecalciferol (VITAMIN D) 2000 UNITS CAPS Take 6,000 Units by mouth daily.     . citalopram (CELEXA) 10 MG tablet Take 10 mg by mouth daily.    Marland Kitchen. dronabinol (MARINOL) 5 MG capsule Take 5 mg by mouth at bedtime.     Marland Kitchen. isoniazid (NYDRAZID) 300 MG tablet Take 300 mg by mouth at bedtime.     Marland Kitchen. loratadine (CLARITIN) 10 MG tablet Take 1 tablet (10 mg total) by mouth daily. 30 tablet 2  . metoCLOPramide (REGLAN) 10 MG tablet Take 10 mg by mouth 1 day or 1 dose.    Marland Kitchen. OXcarbazepine (TRILEPTAL) 150 MG tablet Take 150  mg by mouth at bedtime. For neuropathic pain    . Prenatal Vit-Fe Fumarate-FA (PRENATAL PO) Take 1 tablet by mouth daily. Vitafusion PreNatal Gummy (with DHA, Folic Acid & Multivitamin)    . pyridOXINE (VITAMIN B-6) 100 MG tablet Take 100 mg by mouth at bedtime.     Marland Kitchen zolpidem (AMBIEN) 5 MG tablet Take 1 tablet (5 mg total) by mouth at bedtime. 30 tablet 0   No current facility-administered medications on file prior to visit.      Allergies  Allergen Reactions  . Amitriptyline   . Sumatriptan Nausea  And Vomiting and Other (See Comments)    lockjaw    ROS:  Out of a complete 14 system review of symptoms, the patient complains only of the following symptoms, and all other reviewed systems are negative.  Weight gain, fatigue Ringing in the ears, spinning sensation Birthmarks, itching, moles Joint pain, muscle cramps, aching muscles Allergies, skin sensitivity Memory loss, confusion, headache, weakness, slurred speech, dizziness, passing out Depression, anxiety, not enough sleep, decreased energy, disinterest in activity Insomnia, sleepiness  Blood pressure 109/75, pulse 78, height 5\' 2"  (1.575 m), weight 212 lb 6.4 oz (96.344 kg), last menstrual period 03/31/2014.  Physical Exam  General: The patient is alert and cooperative at the time of the examination. The patient is markedly obese.  Eyes: Pupils are equal, round, and reactive to light. Discs are flat bilaterally.  Neck: The neck is supple, no carotid bruits are noted.  Respiratory: The respiratory examination is clear.  Cardiovascular: The cardiovascular examination reveals a regular rate and rhythm, no obvious murmurs or rubs are noted.  Neuromuscular: Range of movement of the cervical spine is full.  Skin: Extremities are without significant edema.  Neurologic Exam  Mental status: The patient is alert and oriented x 3 at the time of the examination. The patient has apparent normal recent and remote memory, with an apparently normal attention span and concentration ability.  Cranial nerves: Facial symmetry is present. There is good sensation of the face to pinprick and soft touch bilaterally. The strength of the facial muscles and the muscles to head turning and shoulder shrug are normal bilaterally. Speech is well enunciated, no aphasia or dysarthria is noted. Extraocular movements are full. Visual fields are full. The tongue is midline, and the patient has symmetric elevation of the soft palate. No obvious hearing  deficits are noted.  Motor: The motor testing reveals 5 over 5 strength of all 4 extremities. Good symmetric motor tone is noted throughout.  Sensory: Sensory testing is intact to pinprick, soft touch, vibration sensation, and position sense on all 4 extremities. No evidence of extinction is noted.  Coordination: Cerebellar testing reveals good finger-nose-finger and heel-to-shin bilaterally.  Gait and station: Gait is normal. Tandem gait is normal. Romberg is negative. No drift is seen.  Reflexes: Deep tendon reflexes are symmetric and normal bilaterally. Toes are downgoing bilaterally.   Assessment/Plan:  1. Migraine headache  2. Multiple sclerosis  3. Fibromyalgia  4. Obesity  The patient has a clinically normal neurologic examination. We will need to obtain medical records. The patient has a history of latent tuberculosis, and a normal neurologic examination, yet she was placed on a very aggressive treatment for multiple sclerosis to include Tysabri. We will need records of the MRI evaluation that was done previously. The patient is not on a daily prophylactic medication for her migraine. She has received 1 Botox injection in July 2015, and she found this injection was very  painful for her. The patient is willing to continue the  Botox however. I will continue the Tysabri treatments until I get further medical information regarding her multiple sclerosis. Given the history of the tuberculosis and a normal neurologic examination, I would have opted for a safer treatment such as Copaxone. The patient will follow-up in 4 months. She will have blood work done today, she will be started on Zonegran for her migraine.   The patient was seen previously by Dr. Drexel Iha. Telephone number is [phone]. The fax number is [phone].   Addendum: Medical records have been received, MRI of the brain was last done on 01/09/2014 showing white matter changes consistent with multiple sclerosis,  lesion load is felt to be light. 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 She is JC virus antibody negative. A 10 mm lesion was seen in the left parietal area that was enhancing. This was done in November 2014. A repeat MRI done in January 2015 showed 2 new nonenhancing lesions.   Marlan Palau MD 04/12/2014 11:12 AM  [facility] [address] [place], [place] [address]  Phone [phone] Fax [phone]

## 2014-04-13 LAB — COMPREHENSIVE METABOLIC PANEL
ALT: 27 IU/L (ref 0–32)
AST: 27 IU/L (ref 0–40)
Albumin/Globulin Ratio: 1.6 (ref 1.1–2.5)
Albumin: 4.3 g/dL (ref 3.5–5.5)
Alkaline Phosphatase: 64 IU/L (ref 39–117)
BUN/Creatinine Ratio: 7 — ABNORMAL LOW (ref 8–20)
BUN: 5 mg/dL — AB (ref 6–20)
CALCIUM: 9.6 mg/dL (ref 8.7–10.2)
CHLORIDE: 97 mmol/L (ref 97–108)
CO2: 22 mmol/L (ref 18–29)
Creatinine, Ser: 0.68 mg/dL (ref 0.57–1.00)
GFR calc non Af Amer: 121 mL/min/{1.73_m2} (ref >59)
GFR, EST AFRICAN AMERICAN: 140 mL/min/{1.73_m2} (ref >59)
GLUCOSE: 87 mg/dL (ref 65–99)
Globulin, Total: 2.7 g/dL (ref 1.5–4.5)
POTASSIUM: 3.8 mmol/L (ref 3.5–5.2)
Sodium: 138 mmol/L (ref 134–144)
TOTAL PROTEIN: 7 g/dL (ref 6.0–8.5)
Total Bilirubin: 0.3 mg/dL (ref 0.0–1.2)

## 2014-04-13 LAB — CBC WITH DIFFERENTIAL
BASOS: 0 %
Basophils Absolute: 0 10*3/uL (ref 0.0–0.2)
EOS: 2 %
Eosinophils Absolute: 0.2 10*3/uL (ref 0.0–0.4)
HEMATOCRIT: 38.6 % (ref 34.0–46.6)
HEMOGLOBIN: 12.7 g/dL (ref 11.1–15.9)
Immature Grans (Abs): 0 10*3/uL (ref 0.0–0.1)
Immature Granulocytes: 0 %
LYMPHS ABS: 3.3 10*3/uL — AB (ref 0.7–3.1)
LYMPHS: 43 %
MCH: 28.5 pg (ref 26.6–33.0)
MCHC: 32.9 g/dL (ref 31.5–35.7)
MCV: 87 fL (ref 79–97)
MONOCYTES: 8 %
Monocytes Absolute: 0.6 10*3/uL (ref 0.1–0.9)
NEUTROS ABS: 3.6 10*3/uL (ref 1.4–7.0)
Neutrophils Relative %: 47 %
Platelets: 245 10*3/uL (ref 150–379)
RBC: 4.46 x10E6/uL (ref 3.77–5.28)
RDW: 15.4 % (ref 12.3–15.4)
WBC: 7.7 10*3/uL (ref 3.4–10.8)

## 2014-04-19 ENCOUNTER — Telehealth: Payer: Self-pay | Admitting: Neurology

## 2014-04-19 NOTE — Telephone Encounter (Signed)
The JC virus antibody panel was negative.

## 2014-04-29 ENCOUNTER — Telehealth: Payer: Self-pay | Admitting: Neurology

## 2014-04-29 NOTE — Telephone Encounter (Signed)
I have attempted to call this patient and left messages on 04/21/14 and 04/29/14 in attempts to schedule for botox injection.

## 2014-05-03 ENCOUNTER — Telehealth: Payer: Self-pay | Admitting: Neurology

## 2014-05-03 ENCOUNTER — Ambulatory Visit (INDEPENDENT_AMBULATORY_CARE_PROVIDER_SITE_OTHER): Payer: Medicaid Other | Admitting: Family Medicine

## 2014-05-03 ENCOUNTER — Other Ambulatory Visit (HOSPITAL_COMMUNITY)
Admission: RE | Admit: 2014-05-03 | Discharge: 2014-05-03 | Disposition: A | Discharge status: Home / Self Care | Payer: Medicaid Other | Source: Ambulatory Visit | Attending: Internal Medicine | Admitting: Internal Medicine

## 2014-05-03 VITALS — BP 101/63 | HR 81 | Temp 98.0°F | Resp 16 | Ht 62.0 in | Wt 212.0 lb

## 2014-05-03 DIAGNOSIS — Z0000 Encounter for general adult medical examination without abnormal findings: Secondary | ICD-10-CM

## 2014-05-03 DIAGNOSIS — G35 Multiple sclerosis: Secondary | ICD-10-CM

## 2014-05-03 DIAGNOSIS — Z227 Latent tuberculosis: Secondary | ICD-10-CM

## 2014-05-03 DIAGNOSIS — Z01419 Encounter for gynecological examination (general) (routine) without abnormal findings: Secondary | ICD-10-CM | POA: Diagnosis present

## 2014-05-03 DIAGNOSIS — F329 Major depressive disorder, single episode, unspecified: Secondary | ICD-10-CM

## 2014-05-03 DIAGNOSIS — M797 Fibromyalgia: Secondary | ICD-10-CM

## 2014-05-03 DIAGNOSIS — F32A Depression, unspecified: Secondary | ICD-10-CM

## 2014-05-03 DIAGNOSIS — Z8669 Personal history of other diseases of the nervous system and sense organs: Secondary | ICD-10-CM

## 2014-05-03 DIAGNOSIS — Z6838 Body mass index (BMI) 38.0-38.9, adult: Secondary | ICD-10-CM | POA: Insufficient documentation

## 2014-05-03 DIAGNOSIS — L989 Disorder of the skin and subcutaneous tissue, unspecified: Secondary | ICD-10-CM

## 2014-05-03 MED ORDER — PREGABALIN 50 MG PO CAPS: ORAL_CAPSULE | ORAL | Status: DC

## 2014-05-03 NOTE — Progress Notes (Signed)
Subjective:    Patient ID: Frances Dudley, female    DOB: [birth year], 28 y.o.   MRN: [id number]  HPI Frances Dudley is a 28 year old female with a history of multiple sclerosis, fibromyalgia, and depression presents for a complete physical examination with pap smear. Patient states that she has a normal menstrual cycle, which was on 04/27/2014. She states that her last pap smear was 5 years ago, which was normal. She has a 26 year old daughter. She also maintains that she is in a monogamous relationship. She does not exercise and does not eat a balanced diet. She goes to the dentist twice yearly and does not wear sunscreen.   Patient complains of ongoing depression. She complains of anhedonia, depressed mood and fatigue. Onset was approximately several months ago.   She denies current suicidal and homicidal plan or intent.  Previous treatment includes Celexa and she has not had counseling.  The patient also complains of fibromyalgia. She experiences pain daily. Current pain intensity is 4-5/10. She states that she was previously followed by a pain clinic in [place] prior to re-locating. She reports that she was taking Marinol and Trileptal daily with moderate control.   Frances Dudley is also complaining of a mole to right nose. She states that the mole is getting larger, darker, and the texture is changing. She reports that she mole has been present for years. She currently denies itching, burning, pain, or bleeding. She states that it has not been evaluated in the past.   Past Medical History  Diagnosis Date  . Depression   . Multiple sclerosis   . TB lung, latent   . Migraine   . Fibromyalgia 04/12/2014  . Chronic migraine without aura or status migrainosus 04/12/2014    Review of Systems  Constitutional: Positive for fatigue.  Eyes: Negative.   Respiratory: Negative.   Cardiovascular: Negative.   Gastrointestinal: Negative.  Negative for abdominal pain.  Endocrine:  Negative.  Negative for polydipsia, polyphagia and polyuria.  Genitourinary: Negative.  Negative for hematuria, flank pain, decreased urine volume, vaginal discharge and vaginal pain.  Musculoskeletal: Positive for myalgias (Generalized non-articular pain, primarily in lower extremities).  Skin: Positive for color change (She reports that mole on nose is becoming darker and rough in texture. ). Negative for rash.       Skin lesion to right nose  Allergic/Immunologic: Positive for environmental allergies.  Neurological: Positive for headaches (history of migraine headaches). Negative for tremors, syncope and numbness.  Hematological: Negative.   Psychiatric/Behavioral: Positive for sleep disturbance (she reports that her sleep is broken, she will sleep for 3-4 hours at a time. She sleeps off and on all day). Negative for suicidal ideas.       Objective:   Physical Exam  Constitutional: She is oriented to person, place, and time. She appears well-developed and well-nourished.  HENT:  Head: Normocephalic and atraumatic.  Right Ear: External ear normal.  Left Ear: External ear normal.  Mouth/Throat: Oropharynx is clear and moist.  Eyes: Pupils are equal, round, and reactive to light.  Neck: Normal range of motion. Neck supple.  Cardiovascular: Normal rate, regular rhythm and normal heart sounds.   Abdominal: Soft. Bowel sounds are normal.  Genitourinary: Rectum normal and uterus normal. There is breast tenderness. No breast swelling or discharge. Pelvic exam was performed with patient prone. There is no rash, tenderness, lesion or injury on the right labia. There is no rash, tenderness, lesion or injury on the left labia.  Cervix exhibits discharge (scant, white). Right adnexum displays no mass, no tenderness and no fullness. Left adnexum displays no mass, no tenderness and no fullness. No erythema, tenderness or bleeding in the vagina. No foreign body around the vagina. No signs of injury around  the vagina. Vaginal discharge found.  Musculoskeletal: Normal range of motion.       Right wrist: She exhibits tenderness. She exhibits normal range of motion.       Left wrist: She exhibits tenderness. She exhibits normal range of motion.       Lumbar back: She exhibits tenderness and pain. She exhibits normal range of motion, no bony tenderness, no swelling, no edema and no deformity.       Right upper leg: She exhibits tenderness.       Left upper leg: She exhibits tenderness.  Neurological: She is alert and oriented to person, place, and time.  Skin: Skin is warm and dry. Lesion noted.     Psychiatric: She has a normal mood and affect. Her behavior is normal. Judgment and thought content normal.     BP 101/63 mmHg  Pulse 81  Temp(Src) 98 F (36.7 C) (Oral)  Resp 16  Ht 5\' 2"  (1.575 m)  Wt 212 lb (96.163 kg)  BMI 38.77 kg/m2  LMP 04/28/2014     Assessment & Plan:   1. Annual physical exam - Cytology - PAP - Lipid Panel - Urinalysis, Complete Vaccinations up to date Discussed monthly self-breast exam Patient is currently sexually active with partner 2.Skin lesion of face Hyperpigmented, rough texture, round. 0.1 cm lesion to left nose. She reports that lesion is changing. I has darkened over the past several months.  - Ambulatory referral to Dermatology  3. Depression Sleeping in  8-10 hour increments. She reports that she would rather be sleeping. PHQ9 score is 12. She is currently on daily Citalopram 10 mg. I will send referral to Dr. Lajoyce Corners for counseling. She currently denies suicidal or homicidal ideations.   4. Multiple sclerosis Patient is followed by Dr. Anne Hahn. Previous notes faxed to office. According to previous neurology notes, Dr. Anne Hahn will restart Tysabri treatments after reviewing notes from Dr. Drexel Iha.    5. 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 I consulted Benjie Karvonen, PA-C  at [facility], who  indicated that patient was started on Isoniazid 300 mg daily and is to continue for 9 months. She initially started therapy 02/09/2014 and will discontinue in June of 2016. Status was reported in the state of [place] and will not need to be reported in the state of [place] per Dr. Luciana Axe, [facility].  5. Fibromyalgia Currently on Trilemptal and Marinol. Chronic fibromyalgia pain was previously managed by [facility] in [place]. I will send a referral to pain management.  - Ambulatory referral to Pain Clinic  6. History of Migraine Headaches:  Neurological exam normal. Previously started on Zonegran 50 mg and is scheduled to continue botox therapy. She reports that medication has not helped with headaches and she will contact Dr. Anne Hahn.    7. BMI 38.0-38.9,adult Recommend low carbohydrate, low fat diet divided over 6 small meals, increase water intake, and start walking regiment for 30 minutes 3 times per week.     RTC: 3 Months with Dr. Wynona Canes, FNP

## 2014-05-03 NOTE — Telephone Encounter (Signed)
I called patient. The patient indicates that the Zonegran has worsened the headache. She will go off the medication, we will try Lyrica. The patient should be a good candidate for Botox injections.

## 2014-05-03 NOTE — Telephone Encounter (Signed)
Patient stated Rx zonisamide (ZONEGRAN) 50 MG capsule isn't helping with Migraines.  Please call and advise.

## 2014-05-03 NOTE — Telephone Encounter (Signed)
I called back.  Patient says she feels like her headaches have gotten worse since she began taking Zonegran.  She would like to know if the provider can recommend anything else.  Please advise.  Thank you.

## 2014-05-04 ENCOUNTER — Telehealth: Payer: Self-pay

## 2014-05-04 LAB — URINALYSIS, COMPLETE
BILIRUBIN URINE: NEGATIVE
CRYSTALS: NONE SEEN
Casts: NONE SEEN
GLUCOSE, UA: NEGATIVE mg/dL
Hgb urine dipstick: NEGATIVE
Leukocytes, UA: NEGATIVE
Nitrite: NEGATIVE
PROTEIN: NEGATIVE mg/dL
Specific Gravity, Urine: 1.029 (ref 1.005–1.030)
UROBILINOGEN UA: 0.2 mg/dL (ref 0.0–1.0)
pH: 6.5 (ref 5.0–8.0)

## 2014-05-04 LAB — LIPID PANEL
CHOL/HDL RATIO: 5 ratio
Cholesterol: 185 mg/dL (ref 0–200)
HDL: 37 mg/dL — ABNORMAL LOW (ref >39)
LDL Cholesterol: 130 mg/dL — ABNORMAL HIGH (ref 0–99)
TRIGLYCERIDES: 92 mg/dL (ref <150)
VLDL: 18 mg/dL (ref 0–40)

## 2014-05-04 NOTE — Telephone Encounter (Signed)
Faxed in referral to Dr. Neville Route with [facility] to fax number [phone] 05/04/2014 @9 :54am. Thanks!

## 2014-05-05 LAB — CYTOLOGY - PAP

## 2014-05-07 ENCOUNTER — Encounter: Payer: Self-pay | Admitting: Family Medicine

## 2014-05-10 ENCOUNTER — Telehealth: Payer: Self-pay | Admitting: Neurology

## 2014-05-10 NOTE — Telephone Encounter (Signed)
Pt is calling to check the status on the prior auth for pregabalin (LYRICA) 50 MG capsule. Please call and advise.

## 2014-05-10 NOTE — Telephone Encounter (Signed)
I called Medicaid.  They are requesting further medical history.  I called and spoke with the patient who said she has also tried Gabapentin and Cyclobezaprine in the past without benefit.  All info has been forwarded to ins, the request is under review.  Patient is aware.

## 2014-05-11 ENCOUNTER — Ambulatory Visit (INDEPENDENT_AMBULATORY_CARE_PROVIDER_SITE_OTHER): Payer: Medicaid Other | Admitting: Neurology

## 2014-05-11 VITALS — BP 97/74 | HR 72

## 2014-05-11 DIAGNOSIS — R55 Syncope and collapse: Secondary | ICD-10-CM

## 2014-05-11 DIAGNOSIS — G35 Multiple sclerosis: Secondary | ICD-10-CM

## 2014-05-11 DIAGNOSIS — G43719 Chronic migraine without aura, intractable, without status migrainosus: Secondary | ICD-10-CM

## 2014-05-11 DIAGNOSIS — M797 Fibromyalgia: Secondary | ICD-10-CM

## 2014-05-11 MED ORDER — ONABOTULINUMTOXINA 200 UNITS IJ SOLR: 100.0000 [IU] | Freq: Once | INTRAMUSCULAR | Status: DC

## 2014-05-11 NOTE — Progress Notes (Signed)
Frances DineMorgan Dudley is a 28 year old patient with a history of multiple sclerosis, and chronic daily headaches. She comes in today for Botox injections for her migraine. She is reporting brief episodes of "blackouts". The patient will lose her vision for 1-2 seconds. The episodes have occurred previously while on Cymbalta, but they are now occurring again within the last week. The patient may have several events a day.  The patient be set up for an EEG study to evaluate the "blackout episodes". If this study is unremarkable, and the events continue, lumbar puncture may be done to exclude the possibility of pseudotumor cerebri.  The patient is to go on Tysabri, she has not heard within about initiation of this medication. The patient follow-up in 12 weeks for the next Botox injection.

## 2014-05-11 NOTE — Procedures (Signed)
     BOTOX PROCEDURE NOTE FOR MIGRAINE HEADACHE   HISTORY: Frances Dudley is a 28 year old patient with a history of multiple sclerosis who also has chronic daily headaches with intractable migraine. The patient continues to have daily headaches that last all day long. She comes to the office today for Botox injection.   Description of procedure:  The patient was placed in a sitting position. The standard protocol was used for Botox as follows, with 5 units of Botox injected at each site:   -Procerus muscle, midline injection  -Corrugator muscle, bilateral injection  -Frontalis muscle, bilateral injection, with 2 sites each side, medial injection was performed in the upper one third of the frontalis muscle, in the region vertical from the medial inferior edge of the superior orbital rim. The lateral injection was again in the upper one third of the forehead vertically above the lateral limbus of the cornea, 1.5 cm lateral to the medial injection site.  -Temporalis muscle injection, 4 sites, bilaterally. The first injection was 3 cm above the tragus of the ear, second injection site was 1.5 cm to 3 cm up from the first injection site in line with the tragus of the ear. The third injection site was 1.5-3 cm forward between the first 2 injection sites. The fourth injection site was 1.5 cm posterior to the second injection site.  -Occipitalis muscle injection, 3 sites, bilaterally. The first injection was done one half way between the occipital protuberance and the tip of the mastoid process behind the ear. The second injection site was done lateral and superior to the first, 1 fingerbreadth from the first injection. The third injection site was 1 fingerbreadth superiorly and medially from the first injection site.  -Cervical paraspinal muscle injection, 2 sites, bilateral knee first injection site was 1 cm from the midline of the cervical spine, 3 cm inferior to the lower border of the  occipital protuberance. The second injection site was 1.5 cm superiorly and laterally to the first injection site.  -Trapezius muscle injection was performed at 3 sites, bilaterally. The first injection site was in the upper trapezius muscle halfway between the inflection point of the neck, and the acromion. The second injection site was one half way between the acromion and the first injection site. The third injection was done between the first injection site and the inflection point of the neck.   A 200 unit bottle of Botox was used, 155 units were injected, the rest of the Botox was wasted. The patient tolerated the procedure well, there were no complications of the above procedure. Lot number [id number]   Expiration date is August 2018.

## 2014-05-12 ENCOUNTER — Telehealth: Payer: Self-pay | Admitting: Neurology

## 2014-05-12 NOTE — Telephone Encounter (Signed)
I called patient. The patient indicates that she has had brief episodes of dizziness and loss of vision. I would not recommend driving at this is the case.

## 2014-05-12 NOTE — Telephone Encounter (Signed)
WID-Patient questioning if it's ok to drive to [place] due to having black out, before EEG scheduled on 12/22.  Please call and advise.

## 2014-05-13 ENCOUNTER — Encounter: Payer: Self-pay | Admitting: Neurology

## 2014-05-14 ENCOUNTER — Telehealth: Payer: Self-pay | Admitting: Neurology

## 2014-05-14 NOTE — Telephone Encounter (Signed)
Spoke to patient's mother and relayed that her first Tysabri infusion is scheduled for 06-10-13 at 08000 at [facility].  Mother verbalized understanding.

## 2014-05-18 ENCOUNTER — Other Ambulatory Visit: Payer: Medicaid Other | Admitting: Radiology

## 2014-05-27 ENCOUNTER — Encounter: Payer: Self-pay | Admitting: Family Medicine

## 2014-06-02 ENCOUNTER — Other Ambulatory Visit: Payer: Self-pay | Admitting: Neurology

## 2014-06-02 ENCOUNTER — Telehealth: Payer: Self-pay | Admitting: Neurology

## 2014-06-02 DIAGNOSIS — G35 Multiple sclerosis: Secondary | ICD-10-CM

## 2014-06-02 MED ORDER — PREGABALIN 100 MG PO CAPS: 100.0000 mg | ORAL_CAPSULE | Freq: Two times a day (BID) | ORAL | Status: DC

## 2014-06-02 NOTE — Telephone Encounter (Signed)
I called patient. The headaches remain daily in nature. We will go up on the Lyrica taking 50 mg in the morning and 100 mg in the evening for one week, then go to 100 mg twice daily. She is still having episodes of  her vision will suddenly dim out. If her eyes are closed, she may have a flash of light with these events. Etiology is not clear, she will have an EEG study tomorrow.

## 2014-06-02 NOTE — Telephone Encounter (Signed)
Patient is calling because Lyrica and Botox is not helping patient's headache. Please call patient and advise. Patient has an appointment for an EEG 1-7. Thank you.

## 2014-06-03 ENCOUNTER — Ambulatory Visit (INDEPENDENT_AMBULATORY_CARE_PROVIDER_SITE_OTHER): Payer: Medicaid Other | Admitting: Neurology

## 2014-06-03 ENCOUNTER — Telehealth: Payer: Self-pay | Admitting: Neurology

## 2014-06-03 DIAGNOSIS — R55 Syncope and collapse: Secondary | ICD-10-CM

## 2014-06-03 NOTE — Procedures (Signed)
    History:  Frances Dudley is a 29 year old patient with a history of migraine headaches and multiple sclerosis. The patient has reported episodes of dizziness and visual loss, she is being evaluated for these events.  This is a routine EEG. No skull defects are noted. Medications include Celexa, Marinol, isoniazid, Claritin, Reglan, Trileptal, Lyrica, multivitamins, and pyridoxine.   EEG classification: Normal awake  Description of the recording: The background rhythms of this recording consists of a fairly well modulated medium amplitude alpha rhythm of 8 Hz that is reactive to eye opening and closure. As the record progresses, the patient appears to remain in the waking state throughout the recording. Photic stimulation was performed, resulting in a bilateral and symmetric photic driving response. Hyperventilation was also performed, resulting in a minimal buildup of the background rhythm activities without significant slowing seen. At no time during the recording does there appear to be evidence of spike or spike wave discharges or evidence of focal slowing. EKG monitor shows no evidence of cardiac rhythm abnormalities with a heart rate of 84.  Impression: This is a normal EEG recording in the waking state. No evidence of ictal or interictal discharges are seen.

## 2014-06-03 NOTE — Telephone Encounter (Signed)
EEG done today was normal. The results were released on my chart.

## 2014-06-07 ENCOUNTER — Other Ambulatory Visit: Payer: Self-pay

## 2014-06-07 MED ORDER — ISONIAZID 300 MG PO TABS: 300.0000 mg | ORAL_TABLET | Freq: Every day | ORAL | Status: DC

## 2014-06-07 MED ORDER — CITALOPRAM HYDROBROMIDE 10 MG PO TABS: 10.0000 mg | ORAL_TABLET | Freq: Every day | ORAL | Status: DC

## 2014-06-08 ENCOUNTER — Other Ambulatory Visit: Payer: Self-pay

## 2014-06-10 ENCOUNTER — Other Ambulatory Visit (HOSPITAL_COMMUNITY): Payer: Self-pay | Admitting: Neurology

## 2014-06-10 ENCOUNTER — Encounter (HOSPITAL_COMMUNITY): Payer: Self-pay

## 2014-06-10 ENCOUNTER — Encounter (HOSPITAL_COMMUNITY)
Admission: RE | Admit: 2014-06-10 | Discharge: 2014-06-10 | Disposition: A | Discharge status: Home / Self Care | Payer: Medicaid Other | Source: Ambulatory Visit | Attending: Neurology | Admitting: Neurology

## 2014-06-10 VITALS — BP 107/67 | HR 74 | Temp 98.4°F | Resp 16 | Ht 62.0 in | Wt 208.0 lb

## 2014-06-10 DIAGNOSIS — G35 Multiple sclerosis: Secondary | ICD-10-CM | POA: Diagnosis present

## 2014-06-10 DIAGNOSIS — Z5181 Encounter for therapeutic drug level monitoring: Secondary | ICD-10-CM | POA: Diagnosis not present

## 2014-06-10 MED ORDER — SODIUM CHLORIDE 0.9 % IV SOLN
300.0000 mg | INTRAVENOUS | Status: DC
  Administered 2014-06-10: 300 mg via INTRAVENOUS
  Filled 2014-06-10: qty 15

## 2014-06-10 MED ORDER — ACETAMINOPHEN 325 MG PO TABS
650.0000 mg | ORAL_TABLET | ORAL | Status: DC
  Administered 2014-06-10: 650 mg via ORAL
  Filled 2014-06-10: qty 2

## 2014-06-10 MED ORDER — LORATADINE 10 MG PO TABS: 10.0000 mg | ORAL_TABLET | ORAL | Status: DC

## 2014-06-10 MED ORDER — SODIUM CHLORIDE 0.9 % IV SOLN
INTRAVENOUS | Status: DC
  Administered 2014-06-10: 250 mL via INTRAVENOUS

## 2014-06-10 NOTE — Discharge Instructions (Signed)
TYSABRI °Natalizumab injection °What is this medicine? °NATALIZUMAB (na ta LIZ you mab) is used to treat relapsing multiple sclerosis. This drug is not a cure. It is also used to treat Crohn's disease. °This medicine may be used for other purposes; ask your health care provider or pharmacist if you have questions. °COMMON BRAND NAME(S): Tysabri °What should I tell my health care provider before I take this medicine? °They need to know if you have any of these conditions: °-immune system problems °-progressive multifocal leukoencephalopathy (PML) °-an unusual or allergic reaction to natalizumab, other medicines, foods, dyes, or preservatives °-pregnant or trying to get pregnant °-breast-feeding °How should I use this medicine? °This medicine is for infusion into a vein. It is given by a health care professional in a hospital or clinic setting. °A special MedGuide will be given to you by the pharmacist with each prescription and refill. Be sure to read this information carefully each time. °Talk to your pediatrician regarding the use of this medicine in children. This medicine is not approved for use in children. °Overdosage: If you think you have taken too much of this medicine contact a poison control center or emergency room at once. °NOTE: This medicine is only for you. Do not share this medicine with others. °What if I miss a dose? °It is important not to miss your dose. Call your doctor or health care professional if you are unable to keep an appointment. °What may interact with this medicine? °-azathioprine °-cyclosporine °-interferon °-6-mercaptopurine °-methotrexate °-steroid medicines like prednisone or cortisone °-TNF-alpha inhibitors like adalimumab, etanercept, and infliximab °-vaccines °This list may not describe all possible interactions. Give your health care provider a list of all the medicines, herbs, non-prescription drugs, or dietary supplements you use. Also tell them if you smoke, drink alcohol,  or use illegal drugs. Some items may interact with your medicine. °What should I watch for while using this medicine? °Your condition will be monitored carefully while you are receiving this medicine. Visit your doctor for regular check ups. Tell your doctor or healthcare professional if your symptoms do not start to get better or if they get worse. °Stay away from people who are sick. Call your doctor or health care professional for advice if you get a fever, chills or sore throat, or other symptoms of a cold or flu. Do not treat yourself. °In some patients, this medicine may cause a serious brain infection that may cause death. If you have any problems seeing, thinking, speaking, walking, or standing, tell your doctor right away. If you cannot reach your doctor, get urgent medical care. °What side effects may I notice from receiving this medicine? °Side effects that you should report to your doctor or health care professional as soon as possible: °-allergic reactions like skin rash, itching or hives, swelling of the face, lips, or tongue °-breathing problems °-changes in vision °-chest pain °-dark urine °-depression, feelings of sadness °-dizziness °-general ill feeling or flu-like symptoms °-irregular, missed, or painful menstrual periods °-light-colored stools °-loss of appetite, nausea °-muscle weakness °-problems with balance, talking, or walking °-right upper belly pain °-unusually weak or tired °-yellowing of the eyes or skin °Side effects that usually do not require medical attention (report to your doctor or health care professional if they continue or are bothersome): °-aches, pains °-headache °-stomach upset °-tiredness °This list may not describe all possible side effects. Call your doctor for medical advice about side effects. You may report side effects to FDA at 1-800-FDA-1088. °Where should I   keep my medicine? °This drug is given in a hospital or clinic and will not be stored at home. °NOTE: This  sheet is a summary. It may not cover all possible information. If you have questions about this medicine, talk to your doctor, pharmacist, or health care provider. °© 2015, [employer]/Gold Standard. (2008-07-03 13:33:21) °Multiple Sclerosis °Multiple sclerosis (MS) is a disease of the central nervous system. It leads to the loss of the insulating covering of the nerves (myelin sheath) of your brain. When this happens, brain signals do not get sent properly or may not get sent at all. The age of onset of MS varies.  °CAUSES °The cause of MS is unknown. However, it is more common in the [place] than in the southern [place]. °RISK FACTORS °There is a higher number of women with MS than men. MS is not an illness that is passed down to you from your family members (inherited). However, your risk of MS is higher if you have a relative with MS. °SIGNS AND SYMPTOMS  °The symptoms of MS occur in episodes or attacks. These attacks may last weeks to months. There may be long periods of almost no symptoms between attacks. The symptoms of MS vary. This is because of the many different ways it affects the central nervous system. The main symptoms of MS include: °· Vision problems and eye pain. °· Numbness. °· Weakness. °· Inability to move your arms, hands, feet, or legs (paralysis). °· Balance problems. °· Tremors. °DIAGNOSIS  °Your health care provider can diagnose MS with the help of imaging exams and lab tests. These may include specialized X-ray exams and spinal fluid tests. The best imaging exam to confirm a diagnosis of MS is an MRI. °TREATMENT  °There is no known cure for MS, but there are medicines that can decrease the number and frequency of attacks. Steroids are often used for short-term relief. Physical and occupational therapy may also help. There are also many new alternative or complementary treatments available to help control the symptoms of MS. Ask your health care provider if any of these  other options are right for you. °HOME CARE INSTRUCTIONS  °· Take medicines as directed by your health care provider. °· Exercise as directed by your health care provider. °SEEK MEDICAL CARE IF: °You begin to feel depressed. °SEEK IMMEDIATE MEDICAL CARE IF: °· You develop paralysis. °· You have problems with bladder, bowel, or sexual function. °· You develop mental changes, such as forgetfulness or mood swings. °· You have a period of uncontrolled movements (seizure). °Document Released: 05/11/2000 Document Revised: 05/19/2013 Document Reviewed: 01/19/2013 °ExitCare® Patient Information ©2015 [employer]. This information is not intended to replace advice given to you by your health care provider. Make sure you discuss any questions you have with your health care provider. ° ° °

## 2014-06-10 NOTE — Progress Notes (Signed)
Uneventful infusion of Tysabri and 1 hour suggested observation time. Pt ambulatory to back door of short stay to go to car after discharge

## 2014-06-21 ENCOUNTER — Telehealth: Payer: Self-pay | Admitting: Neurology

## 2014-06-21 MED ORDER — VERAPAMIL HCL ER 120 MG PO TBCR: 120.0000 mg | EXTENDED_RELEASE_TABLET | Freq: Every day | ORAL | Status: DC

## 2014-06-21 NOTE — Telephone Encounter (Signed)
I called patient. She is not at home yet, I will call back later.

## 2014-06-21 NOTE — Telephone Encounter (Signed)
I called back.  Patient said she is taking Lyrica 100mg  twice daily, but still having migraines.  Says the past couple days she has felt nauseated and tired.  At times feels like heart is racing.  She did take an OTC med today for hradache, but is unsure if it was Ibuprofen or Tylenol.  Would like to know what provider recommends.  Please advise.  Thank you.

## 2014-06-21 NOTE — Telephone Encounter (Signed)
I called the patient. The lyrica is causing dizziness and an unsteady gait. We will cut back to 100 mg at night for the Lyrica and start calan 120 mg SR tablet daily for the migraine.

## 2014-06-21 NOTE — Telephone Encounter (Signed)
Patient is calling about Rx Lyrica 100 mg. She tales 2 time/day and is still having migraines, feeling sleepy, has nausea, and balance is off. Please call.

## 2014-06-22 ENCOUNTER — Emergency Department (HOSPITAL_COMMUNITY)
Admission: EM | Admit: 2014-06-22 | Discharge: 2014-06-22 | Disposition: A | Discharge status: Home / Self Care | Payer: Medicaid Other | Source: Home / Self Care | Attending: Emergency Medicine | Admitting: Emergency Medicine

## 2014-06-22 ENCOUNTER — Encounter (HOSPITAL_COMMUNITY): Payer: Self-pay | Admitting: Emergency Medicine

## 2014-06-22 DIAGNOSIS — H6983 Other specified disorders of Eustachian tube, bilateral: Secondary | ICD-10-CM | POA: Diagnosis not present

## 2014-06-22 DIAGNOSIS — H6593 Unspecified nonsuppurative otitis media, bilateral: Secondary | ICD-10-CM

## 2014-06-22 MED ORDER — PSEUDOEPHEDRINE HCL 60 MG PO TABS: 60.0000 mg | ORAL_TABLET | Freq: Every day | ORAL | Status: DC

## 2014-06-22 MED ORDER — FLUTICASONE PROPIONATE 50 MCG/ACT NA SUSP: 2.0000 | Freq: Every day | NASAL | Status: AC

## 2014-06-22 NOTE — Discharge Instructions (Signed)
Pressure Equalization Tubes °Pressure equalizing tubes (PE tubes) are small tubes that are placed through a tiny surgical cut in the eardrum. PE tubes are also called tympanostomy tubes or ventilation tubes.  °These tubes are usually placed because of: °· Frequent middle ear infections. °· Chronic fluid in the middle ear. °· Hearing or speech problems due to repeated middle ear infections or fluid build up. °PE tubes help prevent: °· Infections °· Fluid build up. °It is believed tubes do this because they keep the middle ear space full of air (ventilated).  °There are two kinds of PE tubes: °· Short term - these tubes usually last only 6 to 9 months. They fall out on their own. °· Long term - these stay in place longer than short term tubes. Often they have to be removed by the surgeon. °Most PE tubes fall out after a while into the outer ear canal. The eardrum seals itself shut. The tube is easily removed from the ear canal by a caregiver or it falls out on its own. °Children are usually given a mild, general anesthetic before surgery. This is something that puts them to sleep. Older children or adults may only need a local anesthetic. This means medicines are used to make the eardrum numb.  °BEFORE THE PROCEDURE °Follow the instructions given by your surgeon as to how to prepare for this surgery. °LET YOUR CAREGIVER KNOW ABOUT:  °· Previous reactions to anesthesia. °· Reactions to anesthesia by anyone in your family. °RISKS AND COMPLICATIONS  °There are few risks to this simple surgery. The anesthesia specialist will discuss the risks of anesthesia. Sometimes the eardrum does not heal after the tube falls out. If a hole in the eardrum persists, the hole can be repaired by minor surgery.  °AFTER THE PROCEDURE °· Follow your surgeon's instructions for care after surgery. Often eardrops are prescribed. °· There may be fluid draining from the ear for a few days after the surgery. Fluid may also drain in the future  with colds. °· If hearing was decreased due to fluid build up, there should be an improvement right after the surgery. °HOME CARE INSTRUCTIONS  °Because the PE tube opens a tiny hole between the outer and the middle ear, water can accidentally travel into the middle ear from the outside. Your surgeon may suggest earplugs. It is best to avoid: °· Dunking the head in bath water. °· Diving. °SEEK MEDICAL CARE IF:  °· Ear drainage that looks thick, smells bad or is bloody. °· Decreased hearing. °· Balance problems. °· Ear pain. °SEEK IMMEDIATE MEDICAL CARE IF:  °· Redness, tenderness or swelling of the ear canal or ear itself. °Document Released: 11/03/2001 Document Revised: 08/06/2011 Document Reviewed: 06/03/2008 °ExitCare® Patient Information ©2015 [employer]. This information is not intended to replace advice given to you by your health care provider. Make sure you discuss any questions you have with your health care provider. ° °Serous Otitis Media °Serous otitis media is fluid in the middle ear space. This space contains the bones for hearing and air. Air in the middle ear space helps to transmit sound.  °The air gets there through the eustachian tube. This tube goes from the back of the nose (nasopharynx) to the middle ear space. It keeps the pressure in the middle ear the same as the outside world. It also helps to drain fluid from the middle ear space. °CAUSES  °Serous otitis media occurs when the eustachian tube gets blocked. Blockage can come from: °·   Ear infections. °· Colds and other upper respiratory infections. °· Allergies. °· Irritants such as cigarette smoke. °· Sudden changes in air pressure (such as descending in an airplane). °· Enlarged adenoids. °· A mass in the nasopharynx. °During colds and upper respiratory infections, the middle ear space can become temporarily filled with fluid. This can happen after an ear infection also. Once the infection clears, the fluid will generally drain out of  the ear through the eustachian tube. If it does not, then serous otitis media occurs. °SIGNS AND SYMPTOMS  °· Hearing loss. °· A feeling of fullness in the ear, without pain. °· Young children may not show any symptoms but may show slight behavioral changes, such as agitation, ear pulling, or crying. °DIAGNOSIS  °Serous otitis media is diagnosed by an ear exam. Tests may be done to check on the movement of the eardrum. Hearing exams may also be done. °TREATMENT  °The fluid most often goes away without treatment. If allergy is the cause, allergy treatment may be helpful. Fluid that persists for several months may require minor surgery. A small tube is placed in the eardrum to: °· Drain the fluid. °· Restore the air in the middle ear space. °In certain situations, antibiotic medicines are used to avoid surgery. Surgery may be done to remove enlarged adenoids (if this is the cause). °HOME CARE INSTRUCTIONS  °· Keep children away from tobacco smoke. °· Keep all follow-up visits as directed by your health care provider. °SEEK MEDICAL CARE IF:  °· Your hearing is not better in 3 months. °· Your hearing is worse. °· You have ear pain. °· You have drainage from the ear. °· You have dizziness. °· You have serous otitis media only in one ear or have any bleeding from your nose (epistaxis). °· You notice a lump on your neck. °MAKE SURE YOU: °· Understand these instructions.   °· Will watch your condition.   °· Will get help right away if you are not doing well or get worse.   °Document Released: 08/04/2003 Document Revised: 09/28/2013 Document Reviewed: 12/09/2012 °ExitCare® Patient Information ©2015 [employer]. This information is not intended to replace advice given to you by your health care provider. Make sure you discuss any questions you have with your health care provider. ° °

## 2014-06-22 NOTE — ED Provider Notes (Signed)
CSN: [id number]     Arrival date & time 06/22/14  1804 History   First MD Initiated Contact with Patient 06/22/14 1940     Chief Complaint  Patient presents with  . Otalgia   (Consider location/radiation/quality/duration/timing/severity/associated sxs/prior Treatment) HPI Comments: +Recent URI  Patient is a 29 y.o. female presenting with ear pain. The history is provided by the patient.  Otalgia Location:  Bilateral Quality:  Aching Severity:  Mild Onset quality:  Gradual Duration:  24 hours Timing:  Constant Progression:  Unchanged Chronicity:  New Relieved by: ibuprofen. Associated symptoms: congestion   Associated symptoms: no abdominal pain, no cough, no diarrhea, no ear discharge, no fever, no headaches, no hearing loss, no neck pain, no rash, no rhinorrhea, no sore throat, no tinnitus and no vomiting   Associated symptoms comment:  +mild dizziness and nausea with changes in head position   Past Medical History  Diagnosis Date  . Depression   . Multiple sclerosis   . TB lung, latent   . Migraine   . Fibromyalgia 04/12/2014  . Chronic migraine without aura or status migrainosus 04/12/2014   Past Surgical History  Procedure Laterality Date  . None     Family History  Problem Relation Age of Onset  . Adopted: Yes  . Fibromyalgia Mother    History  Substance Use Topics  . Smoking status: Never Smoker   . Smokeless tobacco: Never Used  . Alcohol Use: Yes     Comment: occ   OB History    No data available     Review of Systems  Constitutional: Negative for fever.  HENT: Positive for congestion and ear pain. Negative for ear discharge, hearing loss, nosebleeds, rhinorrhea, sore throat and tinnitus.   Eyes: Negative.   Respiratory: Negative for cough, chest tightness and shortness of breath.   Cardiovascular: Negative.   Gastrointestinal: Positive for nausea. Negative for vomiting, abdominal pain and diarrhea.  Musculoskeletal: Negative for neck pain.   Skin: Negative for rash.  Neurological: Positive for dizziness. Negative for headaches.    Allergies  Amitriptyline; Sumatriptan; and Zonegran  Home Medications   Prior to Admission medications   Medication Sig Start Date End Date Taking? Authorizing Provider  Botulinum Toxin Type A (BOTOX) 200 UNITS SOLR Inject 100 Units as directed once. 05/11/14  Yes York Spaniel, MD  Cholecalciferol (VITAMIN D) 2000 UNITS CAPS Take 6,000 Units by mouth daily.    Yes Historical Provider, MD  citalopram (CELEXA) 10 MG tablet Take 1 tablet (10 mg total) by mouth daily. 06/07/14  Yes Massie Maroon, FNP  dronabinol (MARINOL) 5 MG capsule Take 5 mg by mouth at bedtime.    Yes Historical Provider, MD  isoniazid (NYDRAZID) 300 MG tablet Take 1 tablet (300 mg total) by mouth at bedtime. 06/07/14  Yes Massie Maroon, FNP  loratadine (CLARITIN) 10 MG tablet Take 1 tablet (10 mg total) by mouth daily. 04/06/14  Yes Massie Maroon, FNP  OXcarbazepine (TRILEPTAL) 150 MG tablet Take 150 mg by mouth at bedtime. For neuropathic pain   Yes Historical Provider, MD  pregabalin (LYRICA) 100 MG capsule Take 1 capsule (100 mg total) by mouth 2 (two) times daily. Patient taking differently: Take 100 mg by mouth daily.  06/02/14  Yes York Spaniel, MD  Prenatal Vit-Fe Fumarate-FA (PRENATAL PO) Take 1 tablet by mouth daily. Vitafusion PreNatal Gummy (with DHA, Folic Acid & Multivitamin)   Yes Historical Provider, MD  pyridOXINE (VITAMIN B-6) 100 MG tablet Take  100 mg by mouth at bedtime.    Yes Historical Provider, MD  verapamil (CALAN-SR) 120 MG CR tablet Take 1 tablet (120 mg total) by mouth at bedtime. 06/21/14  Yes York Spaniel, MD  fluticasone [facility]) 50 MCG/ACT nasal spray Place 2 sprays into both nostrils daily. X 14 days 06/22/14   Ria Clock, PA  pseudoephedrine (SUDAFED) 60 MG tablet Take 1 tablet (60 mg total) by mouth daily with breakfast. X 7 days 06/22/14   Mathis Fare Kirsi Hugh, PA   BP  118/84 mmHg  Pulse 70  Temp(Src) 97.9 F (36.6 C) (Oral)  Resp 16  SpO2 100%  LMP 06/21/2014 Physical Exam  Constitutional: She is oriented to person, place, and time. She appears well-developed and well-nourished. No distress.  +overweight  HENT:  Head: Normocephalic and atraumatic.  Right Ear: Hearing, external ear and ear canal normal. Tympanic membrane is retracted. Tympanic membrane is not injected, not perforated and not erythematous. A middle ear effusion is present.  Left Ear: Hearing, external ear and ear canal normal. Tympanic membrane is retracted. Tympanic membrane is not injected, not perforated and not erythematous. A middle ear effusion is present.  Nose: Nose normal.  Mouth/Throat: Uvula is midline, oropharynx is clear and moist and mucous membranes are normal.  +moderate clear fluid behind both TMs Few small scattered dental cavities. No indication of dental abscess  Eyes: Conjunctivae are normal. No scleral icterus.  Neck: Normal range of motion. Neck supple.  Cardiovascular: Normal rate, regular rhythm and normal heart sounds.   Pulmonary/Chest: Effort normal and breath sounds normal.  Musculoskeletal: Normal range of motion.  Lymphadenopathy:    She has no cervical adenopathy.  Neurological: She is alert and oriented to person, place, and time.  Skin: Skin is warm.  Psychiatric: She has a normal mood and affect. Her behavior is normal.  Nursing note and vitals reviewed.   ED Course  Procedures (including critical care time) Labs Review Labs Reviewed - No data to display  Imaging Review No results found.   MDM   1. Eustachian tube dysfunction, bilateral   2. Bilateral serous otitis media, recurrence not specified, unspecified chronicity   Dizziness with changes in head position likely related to fluid behind both TMs. Sudafed and Flonase as prescribed with PCP follow up if no improvement. No clinical indication of AOM. Tylenol or ibuprofen as directed on  packaging for discomfort.    Ria Clock, [place] 06/22/14 2041

## 2014-06-22 NOTE — ED Notes (Signed)
C/o  Bilateral ear pain.  States pain is more severe on the right with right sided facial pain , right lower neck, and behind right ear.  Denies drainage and fever.  Mild relief with ibuprofen.  On set last night.

## 2014-07-08 ENCOUNTER — Encounter (HOSPITAL_COMMUNITY)
Admission: RE | Admit: 2014-07-08 | Discharge: 2014-07-08 | Disposition: A | Discharge status: Home / Self Care | Payer: Medicaid Other | Source: Ambulatory Visit | Attending: Neurology | Admitting: Neurology

## 2014-07-08 ENCOUNTER — Encounter (HOSPITAL_COMMUNITY): Payer: Self-pay

## 2014-07-08 VITALS — BP 108/75 | HR 79 | Temp 98.3°F | Resp 16 | Ht 62.0 in | Wt 208.0 lb

## 2014-07-08 DIAGNOSIS — G35 Multiple sclerosis: Secondary | ICD-10-CM | POA: Diagnosis not present

## 2014-07-08 MED ORDER — SODIUM CHLORIDE 0.9 % IV SOLN
INTRAVENOUS | Status: DC
  Administered 2014-07-08: 250 mL via INTRAVENOUS

## 2014-07-08 MED ORDER — ACETAMINOPHEN 325 MG PO TABS
650.0000 mg | ORAL_TABLET | ORAL | Status: DC
  Administered 2014-07-08: 650 mg via ORAL
  Filled 2014-07-08: qty 2

## 2014-07-08 MED ORDER — LORATADINE 10 MG PO TABS: 10.0000 mg | ORAL_TABLET | ORAL | Status: DC

## 2014-07-08 MED ORDER — SODIUM CHLORIDE 0.9 % IV SOLN
300.0000 mg | INTRAVENOUS | Status: DC
  Administered 2014-07-08: 300 mg via INTRAVENOUS
  Filled 2014-07-08: qty 15

## 2014-07-08 NOTE — Progress Notes (Signed)
Uneventful infusion of Tysabri and 1 hour suggested post infusion observation. Pt discharged ambulatory to back door of short stay to car

## 2014-07-28 ENCOUNTER — Ambulatory Visit: Payer: Medicaid Other | Admitting: Family Medicine

## 2014-07-29 ENCOUNTER — Ambulatory Visit (INDEPENDENT_AMBULATORY_CARE_PROVIDER_SITE_OTHER): Payer: Medicaid Other | Admitting: Internal Medicine

## 2014-07-29 VITALS — BP 111/68 | HR 92 | Temp 98.1°F | Resp 16 | Ht 62.0 in | Wt 228.0 lb

## 2014-07-29 DIAGNOSIS — Z6838 Body mass index (BMI) 38.0-38.9, adult: Secondary | ICD-10-CM

## 2014-07-29 DIAGNOSIS — N6489 Other specified disorders of breast: Secondary | ICD-10-CM

## 2014-07-29 DIAGNOSIS — Z7251 High risk heterosexual behavior: Secondary | ICD-10-CM

## 2014-07-29 NOTE — Progress Notes (Signed)
Patient ID: Frances Dudley, female   DOB: 1986/05/13, 29 y.o.   MRN: [id number]   Frances Dudley, is a 29 y.o. female  AOZ:[id number]  NGE:[id number]  DOB - [birth year]  CC:  Chief Complaint  Patient presents with  . Follow-up       HPI: Frances Dudley is a 29 y.o. female here today for follow up on obesity and upper back pain. Pt is also wants to pursue a breast reduction. Pt has been having significant back pain and has had to pursue a pain clinic because of this. She has had custom fitted bras which take the pressure off for about 1 hours of wearing the bra, then it support is insufficient and she begins to have upper back pain again.   Patient has No headache, No chest pain, No abdominal pain - No Nausea, No new weakness tingling or numbness, No Cough - SOB.  Allergies  Allergen Reactions  . Amitriptyline   . Sumatriptan Nausea And Vomiting and Other (See Comments)    lockjaw  . Zonegran [Zonisamide]     Intolerance    Past Medical History  Diagnosis Date  . Depression   . Multiple sclerosis   . TB lung, latent   . Migraine   . Fibromyalgia 04/12/2014  . Chronic migraine without aura or status migrainosus 04/12/2014   Current Outpatient Prescriptions on File Prior to Visit  Medication Sig Dispense Refill  . Botulinum Toxin Type A (BOTOX) 200 UNITS SOLR Inject 100 Units as directed once. 2 each 0  . Cholecalciferol (VITAMIN D) 2000 UNITS CAPS Take 6,000 Units by mouth daily.     . citalopram (CELEXA) 10 MG tablet Take 1 tablet (10 mg total) by mouth daily. 30 tablet 3  . fluticasone (FLONASE) 50 MCG/ACT nasal spray Place 2 sprays into both nostrils daily. X 14 days 16 g 0  . HYDROcodone-acetaminophen (NORCO/VICODIN) 5-325 MG per tablet Take 1 tablet by mouth every 6 (six) hours as needed for moderate pain.    Marland Kitchen ibuprofen (ADVIL,MOTRIN) 800 MG tablet Take 800 mg by mouth 2 (two) times daily.    Marland Kitchen isoniazid (NYDRAZID) 300 MG tablet Take 1 tablet (300 mg total) by  mouth at bedtime. 30 tablet 3  . loratadine (CLARITIN) 10 MG tablet Take 1 tablet (10 mg total) by mouth daily. 30 tablet 2  . pregabalin (LYRICA) 100 MG capsule Take 1 capsule (100 mg total) by mouth 2 (two) times daily. (Patient taking differently: Take 100 mg by mouth daily. ) 60 capsule 3  . pyridOXINE (VITAMIN B-6) 100 MG tablet Take 100 mg by mouth at bedtime.     . verapamil (CALAN-SR) 120 MG CR tablet Take 1 tablet (120 mg total) by mouth at bedtime. 30 tablet 2   No current facility-administered medications on file prior to visit.   Family History  Problem Relation Age of Onset  . Adopted: Yes  . Fibromyalgia Mother    History   Social History  . Marital Status: Single    Spouse Name: N/A  . Number of Children: 1  . Years of Education: some colle   Occupational History  . unemployed    Social History Main Topics  . Smoking status: Never Smoker   . Smokeless tobacco: Never Used  . Alcohol Use: Yes     Comment: occ  . Drug Use: No  . Sexual Activity: Not on file   Other Topics Concern  . Not on file   Social History Narrative  Patient is adopted.    Review of Systems: Constitutional: Negative for fever, chills, diaphoresis, activity change, appetite change and fatigue. HENT: Negative for ear pain, nosebleeds, congestion, facial swelling, rhinorrhea, neck pain, neck stiffness and ear discharge.  Eyes: Negative for pain, discharge, redness, itching and visual disturbance. Respiratory: Negative for cough, choking, chest tightness, shortness of breath, wheezing and stridor.  Cardiovascular: Negative for chest pain, palpitations and leg swelling. Gastrointestinal: Negative for abdominal distention. Genitourinary: Negative for dysuria, urgency, frequency, hematuria, flank pain, decreased urine volume, difficulty urinating and dyspareunia.  Neurological: Negative for dizziness, tremors, seizures, syncope, facial asymmetry, speech difficulty, weakness, light-headedness,  numbness and headaches.  Hematological: Negative for adenopathy. Does not bruise/bleed easily. Psychiatric/Behavioral: Negative for hallucinations, behavioral problems, confusion, dysphoric mood, decreased concentration and agitation.     Objective:   Filed Vitals:   07/29/14 1308  BP: 111/68  Pulse: 92  Temp: 98.1 F (36.7 C)  Resp: 16    Physical Exam: Constitutional: Patient is an obese, patient with excessive breat tissue which is poorly supported even with a custom fitted bra. She appears well-developed and well-nourished. No distress. HENT: Normocephalic, atraumatic, External right and left ear normal. Oropharynx is clear and moist.  Eyes: Conjunctivae and EOM are normal. PERRLA, no scleral icterus. Neck: Normal ROM. Neck supple. No JVD. No tracheal deviation. No thyromegaly. CVS: RRR, S1/S2 +, no murmurs, no gallops, no carotid bruit.  Pulmonary: Effort and breath sounds normal, no stridor, rhonchi, wheezes, rales.  Abdominal: Soft. BS +, no distension, tenderness, rebound or guarding.  Musculoskeletal: Normal range of motion. No edema and no tenderness.  Lymphadenopathy: No lymphadenopathy noted, cervical, inguinal or axillary Neuro: Alert. Normal reflexes, muscle tone coordination. No cranial nerve deficit. Skin: Skin is warm and dry. No rash noted. Not diaphoretic. No erythema. No pallor. Psychiatric: Normal mood and affect. Behavior, judgment, thought content normal.  Lab Results  Component Value Date   WBC 7.7 04/12/2014   HGB 12.7 04/12/2014   HCT 38.6 04/12/2014   MCV 87 04/12/2014   PLT 245 04/12/2014   Lab Results  Component Value Date   CREATININE 0.68 04/12/2014   BUN 5* 04/12/2014   NA 138 04/12/2014   K 3.8 04/12/2014   CL 97 04/12/2014   CO2 22 04/12/2014    Lab Results  Component Value Date   HGBA1C 5.3 04/06/2014   Lipid Panel     Component Value Date/Time   CHOL 185 05/03/2014 1505   TRIG 92 05/03/2014 1505   HDL 37* 05/03/2014 1505    CHOLHDL 5.0 05/03/2014 1505   VLDL 18 05/03/2014 1505   LDLCALC 130* 05/03/2014 1505       Assessment and plan:   1. Pendulous breast - Pt has excessive breat tissue which is causing her significant upper back strain. The pain has not been mitigated even with custom fitted bras. I feel that the patient would greatly benefit from breast reduction. I will refer to Dr. Meliton Rattan. - Ambulatory referral to Plastic Surgery  2. BMI 38.0-38.9,adult - Pt is interested in Bariatric surgery for weight reduction.  I have had a discussion about Dietary management as patient is eating only one meal daily. I feel that she will benefit from a comprehensive bariatric intervention as at this young age the patient has significant pain issues associated with her weight. I will refer to the Bariatric Program as I feel that she will have significant problems going forward if her weight is not agressively addressed particularly in light  of her co-morbidities particularly Multiple Sclerosis. - Ambulatory referral to General Surgery  3. Obesity, morbid - See above - Ambulatory referral to General Surgery  4.  Unprotected sex - Discussed STI's and sexual health. Will obtain STI screen. - GC/chlamydia probe amp, urine - HIV antibody (with reflex)   Return in about 6 months (around 01/29/2015).  The patient was given clear instructions to go to ER or return to medical center if symptoms don't improve, worsen or new problems develop. The patient verbalized understanding. The patient was told to call to get lab results if they haven't heard anything in the next week.     This note has been created with Education officer, environmental. Any transcriptional errors are unintentional.    Ayala Ribble A., MD [facility] [place], [place] [phone]   07/29/2014, 3:41 PM

## 2014-07-30 ENCOUNTER — Other Ambulatory Visit: Payer: Self-pay | Admitting: Internal Medicine

## 2014-07-30 ENCOUNTER — Telehealth: Payer: Self-pay | Admitting: Internal Medicine

## 2014-07-30 ENCOUNTER — Encounter: Payer: Self-pay | Admitting: Internal Medicine

## 2014-07-30 DIAGNOSIS — N6489 Other specified disorders of breast: Secondary | ICD-10-CM | POA: Insufficient documentation

## 2014-07-30 LAB — GC/CHLAMYDIA PROBE AMP, URINE
Chlamydia, Swab/Urine, PCR: NEGATIVE
GC Probe Amp, Urine: NEGATIVE

## 2014-07-30 LAB — HIV ANTIBODY (ROUTINE TESTING W REFLEX): HIV: NONREACTIVE

## 2014-07-30 NOTE — Progress Notes (Signed)
Spoke with Dr. Kelly Splinter Re: breast reduction surgery. She has advised mammogram for baseline. Will order mammogram..

## 2014-07-30 NOTE — Progress Notes (Signed)
This has been faxed in and order for mammogram has been set. Thanks!

## 2014-07-30 NOTE — Progress Notes (Signed)
Patient ID: Frances Dudley, female   DOB: 12-29-1985, 29 y.o.   MRN: [id number] Spoke with Dr. Kelly Splinter Re: breast reduction surgery. She has advised mammogram for baseline and notes from my office and Pain Clinic. I will advise patient of need to submit documentation from Pain Clinic.

## 2014-08-02 ENCOUNTER — Ambulatory Visit: Payer: No Typology Code available for payment source | Admitting: Family Medicine

## 2014-08-05 ENCOUNTER — Encounter (HOSPITAL_COMMUNITY): Payer: Self-pay

## 2014-08-05 ENCOUNTER — Encounter (HOSPITAL_COMMUNITY)
Admission: RE | Admit: 2014-08-05 | Discharge: 2014-08-05 | Disposition: A | Discharge status: Home / Self Care | Payer: Medicaid Other | Source: Ambulatory Visit | Attending: Neurology | Admitting: Neurology

## 2014-08-05 VITALS — BP 101/68 | HR 71 | Temp 98.5°F | Resp 16 | Ht 62.0 in | Wt 228.0 lb

## 2014-08-05 DIAGNOSIS — G35 Multiple sclerosis: Secondary | ICD-10-CM | POA: Diagnosis not present

## 2014-08-05 MED ORDER — SODIUM CHLORIDE 0.9 % IV SOLN
INTRAVENOUS | Status: DC
  Administered 2014-08-05: 08:00:00 via INTRAVENOUS

## 2014-08-05 MED ORDER — LORATADINE 10 MG PO TABS
10.0000 mg | ORAL_TABLET | ORAL | Status: AC
  Administered 2014-08-05: 10 mg via ORAL
  Filled 2014-08-05: qty 1

## 2014-08-05 MED ORDER — SODIUM CHLORIDE 0.9 % IV SOLN
300.0000 mg | INTRAVENOUS | Status: DC
Start: 2014-08-05 — End: 2014-08-06
  Administered 2014-08-05: 300 mg via INTRAVENOUS
  Filled 2014-08-05: qty 15

## 2014-08-05 MED ORDER — ACETAMINOPHEN 325 MG PO TABS
650.0000 mg | ORAL_TABLET | ORAL | Status: AC
  Administered 2014-08-05: 650 mg via ORAL
  Filled 2014-08-05: qty 2

## 2014-08-05 NOTE — Progress Notes (Signed)
Uneventful infusion of TYSABRI with 1 hour post infusion observation.

## 2014-08-05 NOTE — Discharge Instructions (Signed)

## 2014-08-09 ENCOUNTER — Telehealth: Payer: Self-pay | Admitting: Neurology

## 2014-08-09 MED ORDER — RIZATRIPTAN BENZOATE 10 MG PO TBDP: 10.0000 mg | ORAL_TABLET | Freq: Three times a day (TID) | ORAL | Status: DC | PRN

## 2014-08-09 MED ORDER — VERAPAMIL HCL ER 180 MG PO TBCR: 180.0000 mg | EXTENDED_RELEASE_TABLET | Freq: Every day | ORAL | Status: DC

## 2014-08-09 NOTE — Telephone Encounter (Signed)
Patient stated she's taking Rx's pregabalin (LYRICA) 100 MG capsule and verapamil (CALAN-SR) 120 MG CR tablet at night, still having break through migraines during the day.  Questioning if she could have a Rx for the on set of migraines as well.  Please call and advise.

## 2014-08-09 NOTE — Telephone Encounter (Signed)
I called the patient. She is having an exacerbation of her headache over the last several weeks. She seemed to initially respond to medications, I'll go up on the verapamil to the 180 tablet. The patient is taking ibuprofen for the headache. She is having daily headaches at this point. The patient will be placed on Maxalt, but I have indicated that she cannot take this daily, no more than twice a week.

## 2014-08-10 ENCOUNTER — Telehealth: Payer: Self-pay | Admitting: *Deleted

## 2014-08-10 NOTE — Telephone Encounter (Signed)
Please call the patient pharmacy they have questions about the Maxalt. The patient states that she has chest pain when she takes this med. They would like to make sure that the physician is aware and if the should still fill the Rx. Please call.

## 2014-08-10 NOTE — Telephone Encounter (Signed)
I called the pharmacy. A indicated that the patient told them that she has chest pains on the Maxalt. I will be seeing her in office next week, we will stop the medication for now. The patient will be getting Botox injections.

## 2014-08-16 ENCOUNTER — Ambulatory Visit: Payer: Medicaid Other | Admitting: Neurology

## 2014-08-16 ENCOUNTER — Telehealth: Payer: Self-pay | Admitting: Neurology

## 2014-08-16 NOTE — Telephone Encounter (Signed)
This patient did not show for a Botox injection procedure today.

## 2014-08-17 ENCOUNTER — Encounter: Payer: Self-pay | Admitting: Neurology

## 2014-08-19 ENCOUNTER — Ambulatory Visit
Admission: RE | Admit: 2014-08-19 | Discharge: 2014-08-19 | Disposition: A | Discharge status: Home / Self Care | Payer: Medicaid Other | Source: Ambulatory Visit | Attending: Internal Medicine | Admitting: Internal Medicine

## 2014-08-19 DIAGNOSIS — N6489 Other specified disorders of breast: Secondary | ICD-10-CM

## 2014-08-23 NOTE — Telephone Encounter (Signed)
Patient was called and advised that she did not qualify for bariatric surgery to be covered on insurance plan. We could still set up appointment if patient was willing to pay out of pocket but patient declined. Thanks!

## 2014-08-31 ENCOUNTER — Other Ambulatory Visit: Payer: Self-pay | Admitting: Neurology

## 2014-08-31 DIAGNOSIS — G35 Multiple sclerosis: Secondary | ICD-10-CM

## 2014-09-03 ENCOUNTER — Encounter (HOSPITAL_COMMUNITY)
Admission: RE | Admit: 2014-09-03 | Discharge: 2014-09-03 | Disposition: A | Discharge status: Home / Self Care | Payer: Medicaid Other | Source: Ambulatory Visit | Attending: Neurology | Admitting: Neurology

## 2014-09-03 ENCOUNTER — Encounter (HOSPITAL_COMMUNITY): Payer: Self-pay

## 2014-09-03 VITALS — BP 111/73 | HR 79 | Temp 98.0°F | Resp 18 | Ht 62.0 in | Wt 228.0 lb

## 2014-09-03 DIAGNOSIS — G35 Multiple sclerosis: Secondary | ICD-10-CM | POA: Diagnosis present

## 2014-09-03 MED ORDER — ACETAMINOPHEN 325 MG PO TABS
650.0000 mg | ORAL_TABLET | ORAL | Status: DC
  Administered 2014-09-03: 650 mg via ORAL
  Filled 2014-09-03: qty 2

## 2014-09-03 MED ORDER — LORATADINE 10 MG PO TABS
10.0000 mg | ORAL_TABLET | ORAL | Status: DC
  Administered 2014-09-03: 10 mg via ORAL
  Filled 2014-09-03: qty 1

## 2014-09-03 MED ORDER — SODIUM CHLORIDE 0.9 % IV SOLN
300.0000 mg | INTRAVENOUS | Status: DC
  Administered 2014-09-03: 300 mg via INTRAVENOUS
  Filled 2014-09-03: qty 15

## 2014-09-03 MED ORDER — SODIUM CHLORIDE 0.9 % IV SOLN
INTRAVENOUS | Status: DC
  Administered 2014-09-03: 250 mL via INTRAVENOUS

## 2014-09-07 ENCOUNTER — Encounter: Payer: Self-pay | Admitting: Neurology

## 2014-09-07 ENCOUNTER — Ambulatory Visit (INDEPENDENT_AMBULATORY_CARE_PROVIDER_SITE_OTHER): Payer: Medicaid Other | Admitting: Neurology

## 2014-09-07 ENCOUNTER — Telehealth: Payer: Self-pay | Admitting: Neurology

## 2014-09-07 VITALS — BP 104/73 | HR 68

## 2014-09-07 DIAGNOSIS — G43719 Chronic migraine without aura, intractable, without status migrainosus: Secondary | ICD-10-CM | POA: Diagnosis not present

## 2014-09-07 MED ORDER — KETOROLAC TROMETHAMINE 10 MG PO TABS: 10.0000 mg | ORAL_TABLET | Freq: Four times a day (QID) | ORAL | Status: DC | PRN

## 2014-09-07 MED ORDER — ISOMETHEPTENE-APAP-DICHLORAL 65-325-100 MG PO CAPS: 1.0000 | ORAL_CAPSULE | Freq: Four times a day (QID) | ORAL | Status: DC | PRN

## 2014-09-07 NOTE — Progress Notes (Signed)
Frances Dudley comes in today for evaluation of headache. She has been getting Botox injections, the last injection resulted in improvement in her migraine headache for 2 months. The patient is now back to having daily headaches, and the headaches last all day long. She indicates that she was having intermittent chest pains, and she could not get Maxalt for this reason. The headache issue is getting worse recently. She returns for Botox evaluation.  A prescription was called in today for Midrin to see if this would help her headache. The patient will continue on her tail and at 180 mg SR tablet daily. If the headaches continue after the Botox injection, she is contact our office, and we will change medication management. Otherwise, she will follow-up in 12 weeks.

## 2014-09-07 NOTE — Telephone Encounter (Signed)
Patient stated insurance will not cover Rx Midrin.  Please call and advise.

## 2014-09-07 NOTE — Telephone Encounter (Signed)
Unfortunately, Medicaid will not cover Midrin (will not cover Fioricet either).  They list the following covered drugs as possible alternatives: Ketorolac, Tramadol, Tramadol with APAP, Tylenol with Codeine, Meloxicam or Indomethacin Capsules.  Please advise.  Thank you.

## 2014-09-07 NOTE — Telephone Encounter (Signed)
I will call in ketorolac instead.

## 2014-09-07 NOTE — Procedures (Signed)
     BOTOX PROCEDURE NOTE FOR MIGRAINE HEADACHE   HISTORY: Frances Dudley is a 29 year old patient with intractable migraine headache. The patient continues to have daily headaches, and the headaches last all day long. She indicates that her last Botox injection in January 2016 was associated with some benefit with the headache lasting 2 months. The headache has returned, and is getting worse at this time. The patient reports that she has only occasional times where she has to lie down with the headache. After the Botox injection, she was having some headache free days which is unusual for her. She returns for the Botox injection.   Description of procedure:  The patient was placed in a sitting position. The standard protocol was used for Botox as follows, with 5 units of Botox injected at each site:   -Procerus muscle, midline injection  -Corrugator muscle, bilateral injection  -Frontalis muscle, bilateral injection, with 2 sites each side, medial injection was performed in the upper one third of the frontalis muscle, in the region vertical from the medial inferior edge of the superior orbital rim. The lateral injection was again in the upper one third of the forehead vertically above the lateral limbus of the cornea, 1.5 cm lateral to the medial injection site.  -Temporalis muscle injection, 4 sites, bilaterally. The first injection was 3 cm above the tragus of the ear, second injection site was 1.5 cm to 3 cm up from the first injection site in line with the tragus of the ear. The third injection site was 1.5-3 cm forward between the first 2 injection sites. The fourth injection site was 1.5 cm posterior to the second injection site.  -Occipitalis muscle injection, 3 sites, bilaterally. The first injection was done one half way between the occipital protuberance and the tip of the mastoid process behind the ear. The second injection site was done lateral and superior to the first, 1  fingerbreadth from the first injection. The third injection site was 1 fingerbreadth superiorly and medially from the first injection site.  -Cervical paraspinal muscle injection, 2 sites, bilateral knee first injection site was 1 cm from the midline of the cervical spine, 3 cm inferior to the lower border of the occipital protuberance. The second injection site was 1.5 cm superiorly and laterally to the first injection site.  -Trapezius muscle injection was performed at 3 sites, bilaterally. The first injection site was in the upper trapezius muscle halfway between the inflection point of the neck, and the acromion. The second injection site was one half way between the acromion and the first injection site. The third injection was done between the first injection site and the inflection point of the neck.   A 200 unit bottle of Botox was used, 155 units were injected, the rest of the Botox was wasted. The patient tolerated the procedure well, there were no complications of the above procedure.  Botox NDC 0454-0981-19 Lot number [id number] Expiration date 11/18

## 2014-09-09 ENCOUNTER — Other Ambulatory Visit: Payer: Self-pay | Admitting: Neurology

## 2014-09-09 DIAGNOSIS — G35 Multiple sclerosis: Secondary | ICD-10-CM

## 2014-09-13 ENCOUNTER — Telehealth: Payer: Self-pay | Admitting: Neurology

## 2014-09-13 MED ORDER — MELOXICAM 15 MG PO TABS: 15.0000 mg | ORAL_TABLET | Freq: Every day | ORAL | Status: DC

## 2014-09-13 NOTE — Telephone Encounter (Signed)
pleae send in meloxicam 15 mg   One po daily prn   #30  #2refills

## 2014-09-13 NOTE — Telephone Encounter (Signed)
Patient indicates Ketorolac is causing nausea, vomiting and increased migraine.  She would like to know if a different medication can be prescribed, or if something else is recommended.  Patient has tried and failed several medications including Imitrex, Maxalt, Gabapentin, Flexeril, Zonegran, Topamax, Propranolol, Amitriptyline, Cymbalta, Verapamil and Lyrica.  She is not able to use Fioricet or Midrin due to ins restrictions/cost.  They list possible covered alternatives as Tramadol, Tramadol with APAP, Tylenol with Codeine, Meloxicam or Indomethacin Capsules.  Dr Anne Hahn is out of the office, forwarding request to [facility] for review.  Please advise.  Thank you.

## 2014-09-13 NOTE — Telephone Encounter (Signed)
Patient is calling in regard to Rx ketoralac 10 mg as the medication is causing vomitting and her migraines are so much worse since she started taking it last Thursday. Please call.

## 2014-09-13 NOTE — Telephone Encounter (Signed)
Rx has been added and sent.  I called back to advise.  She is aware and will call back if anything further is needed.

## 2014-10-01 ENCOUNTER — Encounter (HOSPITAL_COMMUNITY): Payer: Self-pay

## 2014-10-01 ENCOUNTER — Encounter (HOSPITAL_COMMUNITY)
Admission: RE | Admit: 2014-10-01 | Discharge: 2014-10-01 | Disposition: A | Discharge status: Home / Self Care | Payer: Medicaid Other | Source: Ambulatory Visit | Attending: Neurology | Admitting: Neurology

## 2014-10-01 VITALS — BP 111/80 | HR 77 | Temp 98.2°F | Resp 18

## 2014-10-01 DIAGNOSIS — G35 Multiple sclerosis: Secondary | ICD-10-CM | POA: Diagnosis not present

## 2014-10-01 MED ORDER — SODIUM CHLORIDE 0.9 % IV SOLN
300.0000 mg | INTRAVENOUS | Status: DC
  Administered 2014-10-01: 300 mg via INTRAVENOUS
  Filled 2014-10-01: qty 15

## 2014-10-01 MED ORDER — LORATADINE 10 MG PO TABS
10.0000 mg | ORAL_TABLET | ORAL | Status: DC
  Administered 2014-10-01: 10 mg via ORAL
  Filled 2014-10-01: qty 1

## 2014-10-01 MED ORDER — SODIUM CHLORIDE 0.9 % IV SOLN
INTRAVENOUS | Status: AC
  Administered 2014-10-01: 08:00:00 via INTRAVENOUS

## 2014-10-01 MED ORDER — ACETAMINOPHEN 325 MG PO TABS
650.0000 mg | ORAL_TABLET | ORAL | Status: DC
  Administered 2014-10-01: 650 mg via ORAL
  Filled 2014-10-01: qty 2

## 2014-10-11 ENCOUNTER — Telehealth: Payer: Self-pay | Admitting: Neurology

## 2014-10-11 ENCOUNTER — Other Ambulatory Visit: Payer: Self-pay | Admitting: Internal Medicine

## 2014-10-11 MED ORDER — ISONIAZID 300 MG PO TABS
300.0000 mg | ORAL_TABLET | Freq: Every day | ORAL | Status: DC
Start: 2014-10-11 — End: 2015-02-21

## 2014-10-11 MED ORDER — INDOMETHACIN 50 MG PO CAPS: 50.0000 mg | ORAL_CAPSULE | Freq: Two times a day (BID) | ORAL | Status: DC

## 2014-10-11 NOTE — Telephone Encounter (Signed)
Try Inde omethacin, 50 mg after dinner with a lot of water, can take up to 2  Day. No refills - cc Dr Anne Hahn.

## 2014-10-11 NOTE — Telephone Encounter (Signed)
Refill for Nydrazid sent into pharmacy via escript. Thanks!

## 2014-10-11 NOTE — Telephone Encounter (Signed)
Patient is calling in regard to Rx Meloxicam 15 mg.  She stated the Rx is not working anymore for her migraines.  Does she need to up the dosage or does she need a different Rx. Please call.

## 2014-10-11 NOTE — Telephone Encounter (Signed)
I called back and spoke with the patient.  She states Meloxicam is no longer helping her headaches.  She would like to know if a different medication can be prescribed, or if something else is recommended. Patient has tried and failed several medications including Imitrex, Maxalt, Gabapentin, Flexeril, Zonegran, Topamax, Propranolol, Amitriptyline, Cymbalta, Ibuprofen, Ketorolac, Trileptal, Verapamil and Lyrica.  She is not able to use Fioricet or Midrin due to ins restrictions/cost. They list possible covered alternatives as Indomethacin Capsules, Tramadol, Tramadol with APAP or Tylenol with Codeine. Dr Anne Hahn is out of the office, forwarding request to [facility] for review. Please advise. Thank you.

## 2014-10-12 NOTE — Telephone Encounter (Signed)
Patient called wanting to follow up on her message from 10/11/14. Please call and advise. Patient can be reached @ [phone]

## 2014-10-12 NOTE — Telephone Encounter (Signed)
Per previous portion of this encounter, WID prescribed Indomethacin.  I called the patient back to advise.  She is aware and will call us back if anything further is needed.

## 2014-10-14 ENCOUNTER — Telehealth: Payer: Self-pay | Admitting: *Deleted

## 2014-10-14 ENCOUNTER — Ambulatory Visit (INDEPENDENT_AMBULATORY_CARE_PROVIDER_SITE_OTHER): Payer: Medicaid Other | Admitting: Family Medicine

## 2014-10-14 VITALS — BP 111/66 | HR 73 | Temp 98.2°F | Resp 16 | Ht 62.0 in | Wt 223.0 lb

## 2014-10-14 DIAGNOSIS — F411 Generalized anxiety disorder: Secondary | ICD-10-CM | POA: Diagnosis not present

## 2014-10-14 DIAGNOSIS — G43719 Chronic migraine without aura, intractable, without status migrainosus: Secondary | ICD-10-CM | POA: Diagnosis not present

## 2014-10-14 DIAGNOSIS — F32A Depression, unspecified: Secondary | ICD-10-CM

## 2014-10-14 DIAGNOSIS — G4700 Insomnia, unspecified: Secondary | ICD-10-CM

## 2014-10-14 DIAGNOSIS — G35 Multiple sclerosis: Secondary | ICD-10-CM | POA: Diagnosis not present

## 2014-10-14 DIAGNOSIS — F329 Major depressive disorder, single episode, unspecified: Secondary | ICD-10-CM | POA: Diagnosis not present

## 2014-10-14 MED ORDER — CITALOPRAM HYDROBROMIDE 20 MG PO TABS: 20.0000 mg | ORAL_TABLET | Freq: Every day | ORAL | Status: DC

## 2014-10-14 NOTE — Progress Notes (Signed)
Subjective:    Patient ID: Frances Dudley, female    DOB: May 13, 1986, 29 y.o.   MRN: [id number]  HPI [name], a 29 year old female with a history of chronic pain, multiple sclerosis, depression, and fibromyalgia presents with insomnia. Patient states that she is unable to sleep at night. She reports that she averages 3-4 hour of sleep per night. She states that she often takes naps throughout the day. She reports that she has not attempted any OTC interventions to alleviate insomnia. She reports fatigue. Denies shortness of breath, sleep apnea, snoring, chest pain, nausea, vomiting or diarrhea.   Patient also complaining of worsening symptoms of depression. She reports feelings of hopelessness and anhedonia. She denies current suicidal and homicidal plan or intent.   Risk factors include: a previous episode of depression . Current treatment includes Celexa 10 mg daily.   Past Medical History  Diagnosis Date  . Depression   . Multiple sclerosis   . TB lung, latent   . Migraine   . Fibromyalgia 04/12/2014  . Chronic migraine without aura or status migrainosus 04/12/2014   History   Social History  . Marital Status: Single    Spouse Name: N/A  . Number of Children: 1  . Years of Education: some colle   Occupational History  . unemployed    Social History Main Topics  . Smoking status: Never Smoker   . Smokeless tobacco: Never Used  . Alcohol Use: Yes     Comment: occ  . Drug Use: No  . Sexual Activity: Not on file   Other Topics Concern  . Not on file   Social History Narrative   Patient is adopted.   Review of Systems  Constitutional: Positive for fatigue.  Eyes: Negative.   Respiratory: Negative.   Cardiovascular: Negative.   Gastrointestinal: Positive for rectal pain.  Endocrine: Negative.   Genitourinary: Negative.   Musculoskeletal: Negative.   Skin: Negative.   Allergic/Immunologic: Negative.   Neurological: Positive for headaches.   Hematological: Negative.   Psychiatric/Behavioral: Positive for decreased concentration. The patient is nervous/anxious.        Objective:   Physical Exam  Constitutional: She is oriented to person, place, and time. She appears well-developed and well-nourished.  HENT:  Head: Normocephalic and atraumatic.  Right Ear: External ear normal.  Left Ear: External ear normal.  Mouth/Throat: Oropharynx is clear and moist.  Eyes: Conjunctivae and EOM are normal. Pupils are equal, round, and reactive to light.  Neck: Normal range of motion. Neck supple.  Cardiovascular: Normal rate, regular rhythm, normal heart sounds and intact distal pulses.   Pulmonary/Chest: Effort normal and breath sounds normal.  Abdominal: Soft. Bowel sounds are normal.  Musculoskeletal: Normal range of motion.  Neurological: She is alert and oriented to person, place, and time. She has normal reflexes.  Skin: Skin is warm and dry.  Psychiatric: She has a normal mood and affect. Her behavior is normal. Judgment and thought content normal.      BP 111/66 mmHg  Pulse 73  Temp(Src) 98.2 F (36.8 C) (Oral)  Resp 16  Ht  (1.575 m)  Wt 223 lb (101.152 kg)  BMI 40.78 kg/m2  LMP 09/27/2014  Assessment & Plan:  1. Sleeplessness Epworth Sleepiness score is 8, patient has an average amount of daytime sleepiness. Patient is not employed and often sleeps during the day and is up at night. Recommend that patient refrain from sleeping throughout the day and begin an exercise regimen.  Recommend walking 30 minutes per day 3-4 times per week.   2. Anxiety state Ms. Isley states that she experiences anxiety several times per week. She reports that anxiety is related to chronic conditions and stressors at home. Patient stopped going to counseling several months ago. She states that it was not helping her situation. Recommended additional counseling, patient refused.   3. Depression Reviewed PHQ-9=9, Recommend  counseling. Also, will increase current dose of Celexa. She denies any suicidal or homicidal ideations.  - citalopram (CELEXA) 20 MG tablet; Take 1 tablet (20 mg total) by mouth daily.  Dispense: 30 tablet; Refill: 0  4. Intractable chronic migraine without aura and without status migrainosus Patient is currently under the care of Dr. Anne Hahn for daily headaches. She is currently receiving Botox injections. She states that she has not had relief. Patient scheduled to follow up in 12 weeks.   5. Multiple sclerosis Patient is under the care of Dr. Anne Hahn, she is on Tysabri treatment monthly.   RTC: 6 weeks for depression Makynlie Rossini M, FNP

## 2014-10-14 NOTE — Telephone Encounter (Signed)
Form,[employer] Parking Placard sent to [facility] and Dr Anne Hahn 10/14/14.

## 2014-10-15 ENCOUNTER — Encounter: Payer: Self-pay | Admitting: Family Medicine

## 2014-10-15 NOTE — Telephone Encounter (Signed)
Form returned to [facility] [facility]. Dr. Anne Hahn stated handicap placard is not indicated for this patient.

## 2014-10-15 NOTE — Patient Instructions (Signed)
Depression °Depression refers to feeling sad, low, down in the dumps, blue, gloomy, or empty. In general, there are two kinds of depression: °· Normal sadness or normal grief. This kind of depression is one that we all feel from time to time after upsetting life experiences, such as the loss of a job or the ending of a relationship. This kind of depression is considered normal, is short lived, and resolves within a few days to 2 weeks. Depression experienced after the loss of a loved one (bereavement) often lasts longer than 2 weeks but normally gets better with time. °· Clinical depression. This kind of depression lasts longer than normal sadness or normal grief or interferes with your ability to function at home, at work, and in school. It also interferes with your personal relationships. It affects almost every aspect of your life. Clinical depression is an illness. °Symptoms of depression can also be caused by conditions other than those mentioned above, such as: °· Physical illness. Some physical illnesses, including underactive thyroid gland (hypothyroidism), severe anemia, specific types of cancer, diabetes, uncontrolled seizures, heart and lung problems, strokes, and chronic pain are commonly associated with symptoms of depression. °· Side effects of some prescription medicine. In some people, certain types of medicine can cause symptoms of depression. °· Substance abuse. Abuse of alcohol and illicit drugs can cause symptoms of depression. °SYMPTOMS °Symptoms of normal sadness and normal grief include the following: °· Feeling sad or crying for short periods of time. °· Not caring about anything (apathy). °· Difficulty sleeping or sleeping too much. °· No longer able to enjoy the things you used to enjoy. °· Desire to be by oneself all the time (social isolation). °· Lack of energy or motivation. °· Difficulty concentrating or remembering. °· Change in appetite or weight. °· Restlessness or  agitation. °Symptoms of clinical depression include the same symptoms of normal sadness or normal grief and also the following symptoms: °· Feeling sad or crying all the time. °· Feelings of guilt or worthlessness. °· Feelings of hopelessness or helplessness. °· Thoughts of suicide or the desire to harm yourself (suicidal ideation). °· Loss of touch with reality (psychotic symptoms). Seeing or hearing things that are not real (hallucinations) or having false beliefs about your life or the people around you (delusions and paranoia). °DIAGNOSIS  °The diagnosis of clinical depression is usually based on how bad the symptoms are and how long they have lasted. Your health care provider will also ask you questions about your medical history and substance use to find out if physical illness, use of prescription medicine, or substance abuse is causing your depression. Your health care provider may also order blood tests. °TREATMENT  °Often, normal sadness and normal grief do not require treatment. However, sometimes antidepressant medicine is given for bereavement to ease the depressive symptoms until they resolve. °The treatment for clinical depression depends on how bad the symptoms are but often includes antidepressant medicine, counseling with a mental health professional, or both. Your health care provider will help to determine what treatment is best for you. °Depression caused by physical illness usually goes away with appropriate medical treatment of the illness. If prescription medicine is causing depression, talk with your health care provider about stopping the medicine, decreasing the dose, or changing to another medicine. °Depression caused by the abuse of alcohol or illicit drugs goes away when you stop using these substances. Some adults need professional help in order to stop drinking or using drugs. °SEEK IMMEDIATE MEDICAL   CARE IF: °· You have thoughts about hurting yourself or others. °· You lose touch  with reality (have psychotic symptoms). °· You are taking medicine for depression and have a serious side effect. °FOR MORE INFORMATION °· National Alliance on Mental Illness: www.nami.org  °· National Institute of Mental Health: www.nimh.nih.gov  °Document Released: 05/11/2000 Document Revised: 09/28/2013 Document Reviewed: 08/13/2011 °ExitCare® Patient Information ©2015 [employer]. This information is not intended to replace advice given to you by your health care provider. Make sure you discuss any questions you have with your health care provider. ° °Insomnia °Insomnia is frequent trouble falling and/or staying asleep. Insomnia can be a long term problem or a short term problem. Both are common. Insomnia can be a short term problem when the wakefulness is related to a certain stress or worry. Long term insomnia is often related to ongoing stress during waking hours and/or poor sleeping habits. Overtime, sleep deprivation itself can make the problem worse. Every little thing feels more severe because you are overtired and your ability to cope is decreased. °CAUSES  °· Stress, anxiety, and depression. °· Poor sleeping habits. °· Distractions such as TV in the bedroom. °· Naps close to bedtime. °· Engaging in emotionally charged conversations before bed. °· Technical reading before sleep. °· Alcohol and other sedatives. They may make the problem worse. They can hurt normal sleep patterns and normal dream activity. °· Stimulants such as caffeine for several hours prior to bedtime. °· Pain syndromes and shortness of breath can cause insomnia. °· Exercise late at night. °· Changing time zones may cause sleeping problems (jet lag). °It is sometimes helpful to have someone observe your sleeping patterns. They should look for periods of not breathing during the night (sleep apnea). They should also look to see how long those periods last. If you live alone or observers are uncertain, you can also be observed at a  sleep clinic where your sleep patterns will be professionally monitored. Sleep apnea requires a checkup and treatment. Give your caregivers your medical history. Give your caregivers observations your family has made about your sleep.  °SYMPTOMS  °· Not feeling rested in the morning. °· Anxiety and restlessness at bedtime. °· Difficulty falling and staying asleep. °TREATMENT  °· Your caregiver may prescribe treatment for an underlying medical disorders. Your caregiver can give advice or help if you are using alcohol or other drugs for self-medication. Treatment of underlying problems will usually eliminate insomnia problems. °· Medications can be prescribed for short time use. They are generally not recommended for lengthy use. °· Over-the-counter sleep medicines are not recommended for lengthy use. They can be habit forming. °· You can promote easier sleeping by making lifestyle changes such as: °¨ Using relaxation techniques that help with breathing and reduce muscle tension. °¨ Exercising earlier in the day. °¨ Changing your diet and the time of your last meal. No night time snacks. °¨ Establish a regular time to go to bed. °· Counseling can help with stressful problems and worry. °· Soothing music and white noise may be helpful if there are background noises you cannot remove. °· Stop tedious detailed work at least one hour before bedtime. °HOME CARE INSTRUCTIONS  °· Keep a diary. Inform your caregiver about your progress. This includes any medication side effects. See your caregiver regularly. Take note of: °¨ Times when you are asleep. °¨ Times when you are awake during the night. °¨ The quality of your sleep. °¨ How you feel the next day. °This information   will help your caregiver care for you. °· Get out of bed if you are still awake after 15 minutes. Read or do some quiet activity. Keep the lights down. Wait until you feel sleepy and go back to bed. °· Keep regular sleeping and waking hours. Avoid  naps. °· Exercise regularly. °· Avoid distractions at bedtime. Distractions include watching television or engaging in any intense or detailed activity like attempting to balance the household checkbook. °· Develop a bedtime ritual. Keep a familiar routine of bathing, brushing your teeth, climbing into bed at the same time each night, listening to soothing music. Routines increase the success of falling to sleep faster. °· Use relaxation techniques. This can be using breathing and muscle tension release routines. It can also include visualizing peaceful scenes. You can also help control troubling or intruding thoughts by keeping your mind occupied with boring or repetitive thoughts like the old concept of counting sheep. You can make it more creative like imagining planting one beautiful flower after another in your backyard garden. °· During your day, work to eliminate stress. When this is not possible use some of the previous suggestions to help reduce the anxiety that accompanies stressful situations. °MAKE SURE YOU:  °· Understand these instructions. °· Will watch your condition. °· Will get help right away if you are not doing well or get worse. °Document Released: 05/11/2000 Document Revised: 08/06/2011 Document Reviewed: 06/11/2007 °ExitCare® Patient Information ©2015 [employer]. This information is not intended to replace advice given to you by your health care provider. Make sure you discuss any questions you have with your health care provider. ° °

## 2014-10-29 ENCOUNTER — Encounter (HOSPITAL_COMMUNITY)
Admission: RE | Admit: 2014-10-29 | Discharge: 2014-10-29 | Disposition: A | Discharge status: Home / Self Care | Payer: Medicaid Other | Source: Ambulatory Visit | Attending: Neurology | Admitting: Neurology

## 2014-10-29 ENCOUNTER — Encounter (HOSPITAL_COMMUNITY): Payer: Self-pay

## 2014-10-29 VITALS — BP 106/68 | HR 79 | Temp 98.4°F | Resp 18 | Ht 62.5 in | Wt 220.6 lb

## 2014-10-29 DIAGNOSIS — G35 Multiple sclerosis: Secondary | ICD-10-CM | POA: Diagnosis present

## 2014-10-29 MED ORDER — SODIUM CHLORIDE 0.9 % IV SOLN
300.0000 mg | INTRAVENOUS | Status: DC
  Administered 2014-10-29: 300 mg via INTRAVENOUS
  Filled 2014-10-29: qty 15

## 2014-10-29 MED ORDER — SODIUM CHLORIDE 0.9 % IV SOLN
INTRAVENOUS | Status: DC
  Administered 2014-10-29: 10:00:00 via INTRAVENOUS

## 2014-10-29 MED ORDER — LORATADINE 10 MG PO TABS
10.0000 mg | ORAL_TABLET | ORAL | Status: DC
  Administered 2014-10-29: 10 mg via ORAL
  Filled 2014-10-29: qty 1

## 2014-10-29 MED ORDER — ACETAMINOPHEN 325 MG PO TABS
650.0000 mg | ORAL_TABLET | ORAL | Status: DC
  Administered 2014-10-29: 650 mg via ORAL
  Filled 2014-10-29: qty 2

## 2014-11-05 ENCOUNTER — Telehealth: Payer: Self-pay | Admitting: Neurology

## 2014-11-05 MED ORDER — INDOMETHACIN ER 75 MG PO CPCR: 75.0000 mg | ORAL_CAPSULE | Freq: Two times a day (BID) | ORAL | Status: DC

## 2014-11-05 NOTE — Telephone Encounter (Signed)
Patient called and requested Rx. indomethacin (INDOCIN) 50 MG capsule be sent to the [id number] - [place], [place] - [address]. Please call and advise.

## 2014-11-05 NOTE — Telephone Encounter (Signed)
Patient called and stated that her migraines are returning and are very strong, she would like to speak with the nurse regarding this. Please call and advise.

## 2014-11-05 NOTE — Telephone Encounter (Signed)
I called the patient. Frances Dudley has already spoke to her and forwarded the message to Dr. Anne Hahn.

## 2014-11-05 NOTE — Telephone Encounter (Signed)
I called patient. Indomethacin seemed to help initially, we will go up on the dose taking 75 mg twice daily. The patient will follow-up for her Botox injections which do seem to help some. The patient has not responded to propranolol, amitriptyline, Topamax in the past. She currently is on Lyrica and verapamil.

## 2014-11-05 NOTE — Telephone Encounter (Signed)
I called the patient back.  Says she is still having migraines.  Indicates Indomethacin was initially helping, however it is no longer as beneficial.  Questioning if either the dose can be increased, or if a different medication is recommended.  Patient has tried and failed numerous medications including: Imitrex, Maxalt, Gabapentin, Flexeril, Zonegran, Topamax, Propranolol, Amitriptyline, Cymbalta, Ibuprofen, Ketorolac, Trileptal, Verapamil and Lyrica.  She is not able to use Fioricet or Midrin due to ins restrictions/cost.  Please advise.  Thank you.

## 2014-11-26 ENCOUNTER — Encounter (HOSPITAL_COMMUNITY)
Admission: RE | Admit: 2014-11-26 | Discharge: 2014-11-26 | Disposition: A | Discharge status: Home / Self Care | Payer: Medicaid Other | Source: Ambulatory Visit | Attending: Neurology | Admitting: Neurology

## 2014-11-26 ENCOUNTER — Encounter (HOSPITAL_COMMUNITY): Payer: Self-pay

## 2014-11-26 VITALS — BP 109/71 | HR 65 | Temp 97.6°F | Resp 16

## 2014-11-26 DIAGNOSIS — G35 Multiple sclerosis: Secondary | ICD-10-CM | POA: Insufficient documentation

## 2014-11-26 MED ORDER — SODIUM CHLORIDE 0.9 % IV SOLN
300.0000 mg | INTRAVENOUS | Status: DC
  Administered 2014-11-26: 300 mg via INTRAVENOUS
  Filled 2014-11-26: qty 15

## 2014-11-26 MED ORDER — LORATADINE 10 MG PO TABS
10.0000 mg | ORAL_TABLET | ORAL | Status: AC
  Administered 2014-11-26: 10 mg via ORAL
  Filled 2014-11-26: qty 1

## 2014-11-26 MED ORDER — ACETAMINOPHEN 325 MG PO TABS
650.0000 mg | ORAL_TABLET | ORAL | Status: AC
  Administered 2014-11-26: 650 mg via ORAL
  Filled 2014-11-26: qty 2

## 2014-11-26 MED ORDER — SODIUM CHLORIDE 0.9 % IV SOLN
INTRAVENOUS | Status: DC
  Administered 2014-11-26: 09:00:00 via INTRAVENOUS

## 2014-11-26 NOTE — Progress Notes (Signed)
Patient tolerated tysabri and stayed for 1st 30 min of 1hour post observation. Declined to stay for remainder 30 min

## 2014-12-02 ENCOUNTER — Other Ambulatory Visit: Payer: Self-pay | Admitting: Family Medicine

## 2014-12-08 ENCOUNTER — Encounter: Payer: Self-pay | Admitting: Neurology

## 2014-12-08 ENCOUNTER — Ambulatory Visit (INDEPENDENT_AMBULATORY_CARE_PROVIDER_SITE_OTHER): Payer: Medicaid Other | Admitting: Neurology

## 2014-12-08 VITALS — BP 106/75 | HR 74

## 2014-12-08 DIAGNOSIS — G35 Multiple sclerosis: Secondary | ICD-10-CM

## 2014-12-08 DIAGNOSIS — G43719 Chronic migraine without aura, intractable, without status migrainosus: Secondary | ICD-10-CM | POA: Diagnosis not present

## 2014-12-08 DIAGNOSIS — Z5181 Encounter for therapeutic drug level monitoring: Secondary | ICD-10-CM | POA: Diagnosis not present

## 2014-12-08 DIAGNOSIS — M797 Fibromyalgia: Secondary | ICD-10-CM | POA: Diagnosis not present

## 2014-12-08 MED ORDER — ONABOTULINUMTOXINA 100 UNITS IJ SOLR: 200.0000 [IU] | Freq: Once | INTRAMUSCULAR | Status: AC

## 2014-12-08 NOTE — Procedures (Signed)
     BOTOX PROCEDURE NOTE FOR MIGRAINE HEADACHE   HISTORY: Frances Dudley is a 29 year old patient with a history of multiple sclerosis and a history of intractable migraine headache. She has gained good improvement with the headaches following Botox injections lasting about 6 weeks with only occasional headaches during this period. The headaches unfortunately rebound, and she will have daily headaches for 6 weeks prior to the next Botox injection. The patient indicates that she may have some nausea with the headaches, she has headaches all day long. She may be incapacitated with the headaches at least twice a month. She returns for the Botox injections today.   Description of procedure:  The patient was placed in a sitting position. The standard protocol was used for Botox as follows, with 5 units of Botox injected at each site:   -Procerus muscle, midline injection  -Corrugator muscle, bilateral injection  -Frontalis muscle, bilateral injection, with 2 sites each side, medial injection was performed in the upper one third of the frontalis muscle, in the region vertical from the medial inferior edge of the superior orbital rim. The lateral injection was again in the upper one third of the forehead vertically above the lateral limbus of the cornea, 1.5 cm lateral to the medial injection site.  -Temporalis muscle injection, 4 sites, bilaterally. The first injection was 3 cm above the tragus of the ear, second injection site was 1.5 cm to 3 cm up from the first injection site in line with the tragus of the ear. The third injection site was 1.5-3 cm forward between the first 2 injection sites. The fourth injection site was 1.5 cm posterior to the second injection site.  -Occipitalis muscle injection, 3 sites, bilaterally. The first injection was done one half way between the occipital protuberance and the tip of the mastoid process behind the ear. The second injection site was done lateral and  superior to the first, 1 fingerbreadth from the first injection. The third injection site was 1 fingerbreadth superiorly and medially from the first injection site.  -Cervical paraspinal muscle injection, 2 sites, bilateral, the first injection site was 1 cm from the midline of the cervical spine, 3 cm inferior to the lower border of the occipital protuberance. The second injection site was 1.5 cm superiorly and laterally to the first injection site.  -Trapezius muscle injection was performed at 3 sites, bilaterally. The first injection site was in the upper trapezius muscle halfway between the inflection point of the neck, and the acromion. The second injection site was one half way between the acromion and the first injection site. The third injection was done between the first injection site and the inflection point of the neck.   A 200 unit bottle of Botox was used, 155 units were injected, the rest of the Botox was wasted. The patient tolerated the procedure well, there were no complications of the above procedure.  Botox NDC 1610-9604-54 Lot number [id number] Expiration date 2/19

## 2014-12-08 NOTE — Progress Notes (Signed)
Reason for visit: Multiple sclerosis  Frances Dudley is an 29 y.o. female  History of present illness:  [name] is a 29 year old right-handed black female with a history of multiple sclerosis. The patient is on Tysabri, she is tolerating the medication well. She denies any new symptoms of numbness, weakness, vision changes, or difficulty controlling the bowels or the bladder. She does have chronic pain associated with fibromyalgia, and she is followed through the [facility], and she will be undergoing an epidural steroid injection of the low back in the near future. The patient has intractable migraine headaches as well, she receives Botox treatments for this, she gains excellent benefit with the headaches for about 6 weeks or so following the injection, but the headaches return at that point. The headaches occur all day long, associated with some nausea. The patient may be incapacited from the headache once or twice a month. The headaches become daily prior to the next Botox injection. After the injection, the headaches become only occasional in nature. The Botox significantly improves her ability to function. She returns today mainly for the Botox injection.  Past Medical History  Diagnosis Date  . Depression   . Multiple sclerosis   . TB lung, latent   . Migraine   . Fibromyalgia 04/12/2014  . Chronic migraine without aura or status migrainosus 04/12/2014    Past Surgical History  Procedure Laterality Date  . None      Family History  Problem Relation Age of Onset  . Adopted: Yes  . Fibromyalgia Mother     Social history:  reports that she has never smoked. She has never used smokeless tobacco. She reports that she drinks alcohol. She reports that she does not use illicit drugs.    Allergies  Allergen Reactions  . Amitriptyline   . Sumatriptan Nausea And Vomiting and Other (See Comments)    lockjaw  . Zonegran [Zonisamide]     Intolerance      Medications:  Prior to Admission medications   Medication Sig Start Date End Date Taking? Authorizing Provider  ACETAMINOPHEN-BUTALBITAL 50-325 MG TABS Take 50-325 mg by mouth every 8 (eight) hours.    Historical Provider, MD  Botulinum Toxin Type A (BOTOX) 200 UNITS SOLR Inject 100 Units as directed once. 05/11/14   York Spaniel, MD  Cholecalciferol (VITAMIN D) 2000 UNITS CAPS Take 6,000 Units by mouth daily.     Historical Provider, MD  citalopram (CELEXA) 20 MG tablet TAKE 1 TABLET (20 MG TOTAL) BY MOUTH DAILY. 12/02/14   Henrietta Hoover, NP  fluticasone (FLONASE) 50 MCG/ACT nasal spray Place 2 sprays into both nostrils daily. X 14 days 06/22/14   Ria Clock, PA  HYDROcodone-acetaminophen (NORCO/VICODIN) 5-325 MG per tablet Take 1 tablet by mouth every 6 (six) hours as needed for moderate pain.    Historical Provider, MD  ibuprofen (ADVIL,MOTRIN) 800 MG tablet Take 800 mg by mouth 2 (two) times daily.    Historical Provider, MD  indomethacin (INDOCIN SR) 75 MG CR capsule Take 1 capsule (75 mg total) by mouth 2 (two) times daily with a meal. 11/05/14   York Spaniel, MD  isoniazid (NYDRAZID) 300 MG tablet Take 1 tablet (300 mg total) by mouth at bedtime. 10/11/14   Altha Harm, MD  loratadine (CLARITIN) 10 MG tablet Take 1 tablet (10 mg total) by mouth daily. 04/06/14   Massie Maroon, FNP  ondansetron (ZOFRAN) 4 MG tablet Take 4 mg by  mouth every 8 (eight) hours as needed for nausea or vomiting.    Historical Provider, MD  oxycodone-acetaminophen (PERCOCET) 2.5-325 MG per tablet Take 1 tablet by mouth every 8 (eight) hours as needed for pain.    Historical Provider, MD  pregabalin (LYRICA) 100 MG capsule Take 1 capsule (100 mg total) by mouth 2 (two) times daily. Patient taking differently: Take 100 mg by mouth daily.  06/02/14   York Spaniel, MD  pyridOXINE (VITAMIN B-6) 100 MG tablet Take 100 mg by mouth at bedtime.     Historical Provider, MD  verapamil  (CALAN-SR) 180 MG CR tablet Take 1 tablet (180 mg total) by mouth at bedtime. 08/09/14   York Spaniel, MD    ROS:  Out of a complete 14 system review of symptoms, the patient complains only of the following symptoms, and all other reviewed systems are negative.  Headache Neck pain, back pain Nausea  Blood pressure 106/75, pulse 74.  Physical Exam  General: The patient is alert and cooperative at the time of the examination. The patient is markedly obese. The patient has a flat affect.  Skin: No significant peripheral edema is noted.   Neurologic Exam  Mental status: The patient is alert and oriented x 3 at the time of the examination. The patient has apparent normal recent and remote memory, with an apparently normal attention span and concentration ability.   Cranial nerves: Facial symmetry is present. Speech is normal, no aphasia or dysarthria is noted. Extraocular movements are full. Visual fields are full. Pupils are equal, round, and reactive to light. Discs are flat bilaterally.  Motor: The patient has good strength in all 4 extremities.  Sensory examination: Soft touch sensation is symmetric on the face, arms, and legs.  Coordination: The patient has good finger-nose-finger and heel-to-shin bilaterally.  Gait and station: The patient has a normal gait. Tandem gait is normal. Romberg is negative. No drift is seen.  Reflexes: Deep tendon reflexes are symmetric.   Assessment/Plan:  1. Multiple sclerosis  2. Fibromyalgia  3. Intractable migraine headache  4. Morbid obesity  The patient will undergo blood work today to include a JC virus antibody test. The patient will have a follow-up MRI of the brain with and without gadolinium enhancement. She received her Botox injections today for her migraine. She will follow-up in about 3 months for the next Botox therapy. She will continue Tysabri treatments.  Marlan Palau MD 12/08/2014 8:09 PM  [facility] [address] [place], [place] [address]  Phone [phone] Fax [phone]

## 2014-12-08 NOTE — Patient Instructions (Signed)

## 2014-12-09 LAB — CBC WITH DIFFERENTIAL/PLATELET
Basophils Absolute: 0 10*3/uL (ref 0.0–0.2)
Basos: 0 %
EOS (ABSOLUTE): 0.2 10*3/uL (ref 0.0–0.4)
Eos: 2 %
HEMATOCRIT: 36.7 % (ref 34.0–46.6)
Hemoglobin: 11.8 g/dL (ref 11.1–15.9)
IMMATURE GRANS (ABS): 0 10*3/uL (ref 0.0–0.1)
Immature Granulocytes: 0 %
Lymphocytes Absolute: 3.9 10*3/uL — ABNORMAL HIGH (ref 0.7–3.1)
Lymphs: 50 %
MCH: 28 pg (ref 26.6–33.0)
MCHC: 32.2 g/dL (ref 31.5–35.7)
MCV: 87 fL (ref 79–97)
MONOS ABS: 0.7 10*3/uL (ref 0.1–0.9)
Monocytes: 8 %
NEUTROS PCT: 40 %
Neutrophils Absolute: 3.2 10*3/uL (ref 1.4–7.0)
Platelets: 211 10*3/uL (ref 150–379)
RBC: 4.22 x10E6/uL (ref 3.77–5.28)
RDW: 14.8 % (ref 12.3–15.4)
WBC: 8.1 10*3/uL (ref 3.4–10.8)

## 2014-12-09 LAB — COMPREHENSIVE METABOLIC PANEL
A/G RATIO: 1.6 (ref 1.1–2.5)
ALK PHOS: 50 IU/L (ref 39–117)
ALT: 15 IU/L (ref 0–32)
AST: 19 IU/L (ref 0–40)
Albumin: 4.1 g/dL (ref 3.5–5.5)
BUN/Creatinine Ratio: 16 (ref 8–20)
BUN: 11 mg/dL (ref 6–20)
Bilirubin Total: 0.2 mg/dL (ref 0.0–1.2)
CO2: 22 mmol/L (ref 18–29)
Calcium: 9.4 mg/dL (ref 8.7–10.2)
Chloride: 102 mmol/L (ref 97–108)
Creatinine, Ser: 0.68 mg/dL (ref 0.57–1.00)
GFR calc non Af Amer: 120 mL/min/{1.73_m2} (ref >59)
GFR, EST AFRICAN AMERICAN: 139 mL/min/{1.73_m2} (ref >59)
Globulin, Total: 2.6 g/dL (ref 1.5–4.5)
Glucose: 89 mg/dL (ref 65–99)
Potassium: 4.8 mmol/L (ref 3.5–5.2)
Sodium: 141 mmol/L (ref 134–144)
Total Protein: 6.7 g/dL (ref 6.0–8.5)

## 2014-12-12 ENCOUNTER — Other Ambulatory Visit: Payer: Self-pay | Admitting: Neurology

## 2014-12-13 ENCOUNTER — Telehealth: Payer: Self-pay | Admitting: Neurology

## 2014-12-13 NOTE — Telephone Encounter (Signed)
JC virus antibody titer was 0.25, indeterminate range.

## 2014-12-13 NOTE — Telephone Encounter (Signed)
Rx has been signed and faxed  

## 2014-12-21 ENCOUNTER — Other Ambulatory Visit: Payer: Self-pay | Admitting: Neurology

## 2014-12-21 DIAGNOSIS — G35 Multiple sclerosis: Secondary | ICD-10-CM

## 2014-12-24 ENCOUNTER — Encounter (HOSPITAL_COMMUNITY): Payer: Self-pay

## 2014-12-24 ENCOUNTER — Encounter (HOSPITAL_COMMUNITY)
Admission: RE | Admit: 2014-12-24 | Discharge: 2014-12-24 | Disposition: A | Discharge status: Home / Self Care | Payer: Medicaid Other | Source: Ambulatory Visit | Attending: Neurology | Admitting: Neurology

## 2014-12-24 VITALS — BP 107/79 | HR 65 | Temp 98.0°F | Resp 18 | Ht 62.0 in | Wt 225.0 lb

## 2014-12-24 DIAGNOSIS — G35 Multiple sclerosis: Secondary | ICD-10-CM | POA: Diagnosis not present

## 2014-12-24 MED ORDER — SODIUM CHLORIDE 0.9 % IV SOLN
INTRAVENOUS | Status: DC
  Administered 2014-12-24: 250 mL via INTRAVENOUS

## 2014-12-24 MED ORDER — EPINEPHRINE 0.3 MG/0.3ML IJ SOAJ
0.3000 mg | Freq: Once | INTRAMUSCULAR | Status: DC
  Filled 2014-12-24: qty 0.6

## 2014-12-24 MED ORDER — ACETAMINOPHEN 325 MG PO TABS
650.0000 mg | ORAL_TABLET | ORAL | Status: DC
  Administered 2014-12-24: 650 mg via ORAL
  Filled 2014-12-24: qty 2

## 2014-12-24 MED ORDER — LORATADINE 10 MG PO TABS
10.0000 mg | ORAL_TABLET | ORAL | Status: DC
  Administered 2014-12-24: 10 mg via ORAL
  Filled 2014-12-24: qty 1

## 2014-12-24 MED ORDER — SODIUM CHLORIDE 0.9 % IV SOLN
300.0000 mg | INTRAVENOUS | Status: DC
  Administered 2014-12-24: 300 mg via INTRAVENOUS
  Filled 2014-12-24: qty 15

## 2014-12-24 NOTE — Progress Notes (Signed)
Uneventful infusion of TYSABRI. Pt discharged ambulatory unaccompanied to elevator to go to main lobby

## 2014-12-27 ENCOUNTER — Encounter (INDEPENDENT_AMBULATORY_CARE_PROVIDER_SITE_OTHER): Payer: Medicaid Other | Admitting: Neurology

## 2014-12-27 ENCOUNTER — Ambulatory Visit
Admission: RE | Admit: 2014-12-27 | Discharge: 2014-12-27 | Disposition: A | Discharge status: Home / Self Care | Payer: Medicaid Other | Source: Ambulatory Visit | Attending: Neurology | Admitting: Neurology

## 2014-12-27 ENCOUNTER — Telehealth: Payer: Self-pay | Admitting: Neurology

## 2014-12-27 DIAGNOSIS — G35 Multiple sclerosis: Secondary | ICD-10-CM | POA: Diagnosis not present

## 2014-12-27 DIAGNOSIS — G43719 Chronic migraine without aura, intractable, without status migrainosus: Secondary | ICD-10-CM

## 2014-12-27 DIAGNOSIS — M797 Fibromyalgia: Secondary | ICD-10-CM

## 2014-12-27 DIAGNOSIS — Z5181 Encounter for therapeutic drug level monitoring: Secondary | ICD-10-CM

## 2014-12-27 MED ORDER — GADOBENATE DIMEGLUMINE 529 MG/ML IV SOLN
20.0000 mL | Freq: Once | INTRAVENOUS | Status: AC | PRN
  Administered 2014-12-27: 20 mL via INTRAVENOUS

## 2014-12-27 MED ORDER — VENLAFAXINE HCL ER 37.5 MG PO CP24: ORAL_CAPSULE | ORAL | Status: DC

## 2014-12-27 NOTE — Telephone Encounter (Signed)
I called the patient. MRI the brain shows very minimal white matter changes that could be consistent with multiple sclerosis. No acute changes are seen.   MRI brain 12/27/2014:  IMPRESSION: This is an abnormal MRI of the brain with and without contrast showing T2/FLAIR hyperintense foci in the periventricular, deep and juxtacortical white matter in a pattern and configuration consistent with the diagnosis of MS. There were no acute findings.

## 2014-12-27 NOTE — Telephone Encounter (Signed)
I called patient. The patient continues to have ongoing daily headaches. The patient denies any neck stiffness, muffled hearing that might be consistent with pseudotumor. She has not had any nausea or vomiting. I will have her stop the verapamil, we will switch over to Effexor for the headache.

## 2014-12-27 NOTE — Telephone Encounter (Signed)
I called the patient. She stated she has been taking everything she has been prescribed (Lyrica, verapamil and ibuprofen 800 mg). None of these medications are helping. She no longer has prescriptions for Norco or Percocet. She has had a migraine since she was last here (7/13).She would like to know if there is there anything else she can take for her migraine.

## 2014-12-27 NOTE — Telephone Encounter (Signed)
Please call pt regarding migraine that has been going on for about 2 weeks

## 2014-12-28 ENCOUNTER — Other Ambulatory Visit: Payer: Self-pay | Admitting: Family Medicine

## 2014-12-29 ENCOUNTER — Telehealth: Payer: Self-pay | Admitting: Neurology

## 2014-12-29 NOTE — Telephone Encounter (Signed)
I called the patient. The patient is on 20 g of Celexa, we are starting on a low dose of the Effexor, if the Effexor appears to be helpful for the headache, we will discontinue the Celexa and go higher on the dose of Effexor.

## 2014-12-29 NOTE — Telephone Encounter (Signed)
Is it ok to take Effexor along with Citalopram? Pharmacist told pt that usually it's either/or not both

## 2015-01-05 ENCOUNTER — Telehealth: Payer: Self-pay | Admitting: *Deleted

## 2015-01-05 MED ORDER — TIZANIDINE HCL 2 MG PO TABS: 2.0000 mg | ORAL_TABLET | Freq: Three times a day (TID) | ORAL | Status: DC

## 2015-01-05 NOTE — Telephone Encounter (Signed)
Form,DMV Disability Parking Placard sent to [facility] and Dr Anne Hahn 01/05/15.

## 2015-01-05 NOTE — Addendum Note (Signed)
Addended by: Stephanie Acre on: 01/05/2015 05:04 PM   Modules accepted: Orders, Medications

## 2015-01-05 NOTE — Telephone Encounter (Signed)
Pt is calling back about Effexor. She said that it is giving her really bad diarrhea.

## 2015-01-05 NOTE — Telephone Encounter (Signed)
I called the patient. The patient indicates that Effexor is causing diarrhea, she will stop the medication. The patient has already been on amitriptyline, Topamax, propranolol, Cymbalta, verapamil without benefit, and she currently is placed on tizanidine by a pain center along with hydrocodone. She is only getting 2 mg of the tizanidine at night, I'll increase this to 2 mg 3 times daily.

## 2015-01-07 ENCOUNTER — Telehealth: Payer: Self-pay | Admitting: Neurology

## 2015-01-07 NOTE — Telephone Encounter (Signed)
Disability placard was not approved/signed by Dr. Anne Hahn. Returned to [employer].

## 2015-01-07 NOTE — Telephone Encounter (Signed)
I called the patient and explained that Dr. Anne Hahn and I reviewed the office notes from her last couple of visits. Her gait was normal. There is no clinical indication for a handicap placard at this time. She verbalized understanding.

## 2015-01-07 NOTE — Telephone Encounter (Signed)
Patient is calling to find out why the disability forms could not be filled out for her. Please call and advise. Thank you.

## 2015-01-21 ENCOUNTER — Encounter (HOSPITAL_COMMUNITY): Payer: Self-pay

## 2015-01-21 ENCOUNTER — Encounter (HOSPITAL_COMMUNITY)
Admission: RE | Admit: 2015-01-21 | Discharge: 2015-01-21 | Disposition: A | Discharge status: Home / Self Care | Payer: Medicaid Other | Source: Ambulatory Visit | Attending: Neurology | Admitting: Neurology

## 2015-01-21 VITALS — BP 110/76 | HR 70 | Temp 98.2°F | Resp 18 | Ht 62.0 in | Wt 225.0 lb

## 2015-01-21 DIAGNOSIS — G35 Multiple sclerosis: Secondary | ICD-10-CM | POA: Diagnosis not present

## 2015-01-21 MED ORDER — SODIUM CHLORIDE 0.9 % IV SOLN
300.0000 mg | INTRAVENOUS | Status: DC
  Administered 2015-01-21: 300 mg via INTRAVENOUS
  Filled 2015-01-21: qty 15

## 2015-01-21 MED ORDER — LORATADINE 10 MG PO TABS
10.0000 mg | ORAL_TABLET | ORAL | Status: DC
Start: 2015-01-21 — End: 2015-01-22
  Administered 2015-01-21: 10 mg via ORAL
  Filled 2015-01-21: qty 1

## 2015-01-21 MED ORDER — SODIUM CHLORIDE 0.9 % IV SOLN
INTRAVENOUS | Status: AC
  Administered 2015-01-21: 10:00:00 via INTRAVENOUS

## 2015-01-21 MED ORDER — ACETAMINOPHEN 325 MG PO TABS
650.0000 mg | ORAL_TABLET | ORAL | Status: DC
  Administered 2015-01-21: 650 mg via ORAL
  Filled 2015-01-21: qty 2

## 2015-01-24 ENCOUNTER — Telehealth: Payer: Self-pay | Admitting: Neurology

## 2015-01-24 MED ORDER — BACLOFEN 10 MG PO TABS: 5.0000 mg | ORAL_TABLET | Freq: Three times a day (TID) | ORAL | Status: DC

## 2015-01-24 NOTE — Telephone Encounter (Signed)
I called the patient. She feels like her difficulty swallowing is coming from her dry mouth. She states when the tizanidine wears off, she no longer has a dry mouth or trouble swallowing. She would like to try a different medication. She also states that since she was last here her memory has gotten really bad.

## 2015-01-24 NOTE — Telephone Encounter (Signed)
Patient called stating the tiZANidine (ZANAFLEX) 2 MG tablet is giving her dry mouth and she is having difficulty swallowing. She is also concerned about her memory. She has difficulty remembering anything, difficulty tyring to remember how to do things. Please call and advise. Patient can be reached at [phone].

## 2015-01-24 NOTE — Telephone Encounter (Signed)
I called the patient. The patient is having dry mouth and some impairment of memory on the tizanidine. We will stop the medication, switched to low-dose baclofen.

## 2015-02-02 ENCOUNTER — Telehealth: Payer: Self-pay | Admitting: Neurology

## 2015-02-02 MED ORDER — DIAZEPAM 2 MG PO TABS: 2.0000 mg | ORAL_TABLET | Freq: Two times a day (BID) | ORAL | Status: DC

## 2015-02-02 NOTE — Telephone Encounter (Signed)
I called the patient. She is having side effects on the baclofen. I will try low-dose diazepam. The patient could not tolerate tizanidine previously.

## 2015-02-02 NOTE — Telephone Encounter (Signed)
Patient is calling because she is having side effects from taking medication baclofen (LIORESAL) 10 MG tablet. She is having shortness of breath, palpitations, feels hot and cold, sweats, irritability, shaky hands, tingling in fingers, nausea, vomiting, ringing in ears and headache. Please call to discuss.

## 2015-02-03 ENCOUNTER — Ambulatory Visit (INDEPENDENT_AMBULATORY_CARE_PROVIDER_SITE_OTHER): Payer: Medicaid Other | Admitting: Family Medicine

## 2015-02-03 ENCOUNTER — Encounter: Payer: Self-pay | Admitting: Family Medicine

## 2015-02-03 DIAGNOSIS — G43719 Chronic migraine without aura, intractable, without status migrainosus: Secondary | ICD-10-CM

## 2015-02-03 DIAGNOSIS — G35 Multiple sclerosis: Secondary | ICD-10-CM

## 2015-02-03 DIAGNOSIS — F329 Major depressive disorder, single episode, unspecified: Secondary | ICD-10-CM | POA: Diagnosis not present

## 2015-02-03 DIAGNOSIS — M797 Fibromyalgia: Secondary | ICD-10-CM

## 2015-02-03 DIAGNOSIS — F32A Depression, unspecified: Secondary | ICD-10-CM

## 2015-02-03 MED ORDER — CITALOPRAM HYDROBROMIDE 20 MG PO TABS: ORAL_TABLET | ORAL | Status: DC

## 2015-02-03 NOTE — Patient Instructions (Addendum)
Patient Depression Depression refers to feeling sad, low, down in the dumps, blue, gloomy, or empty. In general, there are two kinds of depression:  Normal sadness or normal grief. This kind of depression is one that we all feel from time to time after upsetting life experiences, such as the loss of a job or the ending of a relationship. This kind of depression is considered normal, is short lived, and resolves within a few days to 2 weeks. Depression experienced after the loss of a loved one (bereavement) often lasts longer than 2 weeks but normally gets better with time.  Clinical depression. This kind of depression lasts longer than normal sadness or normal grief or interferes with your ability to function at home, at work, and in school. It also interferes with your personal relationships. It affects almost every aspect of your life. Clinical depression is an illness. Symptoms of depression can also be caused by conditions other than those mentioned above, such as:  Physical illness. Some physical illnesses, including underactive thyroid gland (hypothyroidism), severe anemia, specific types of cancer, diabetes, uncontrolled seizures, heart and lung problems, strokes, and chronic pain are commonly associated with symptoms of depression.  Side effects of some prescription medicine. In some people, certain types of medicine can cause symptoms of depression.  Substance abuse. Abuse of alcohol and illicit drugs can cause symptoms of depression. SYMPTOMS Symptoms of normal sadness and normal grief include the following:  Feeling sad or crying for short periods of time.  Not caring about anything (apathy).  Difficulty sleeping or sleeping too much.  No longer able to enjoy the things you used to enjoy.  Desire to be by oneself all the time (social isolation).  Lack of energy or motivation.  Difficulty concentrating or remembering.  Change in appetite or weight.  Restlessness or  agitation. Symptoms of clinical depression include the same symptoms of normal sadness or normal grief and also the following symptoms:  Feeling sad or crying all the time.  Feelings of guilt or worthlessness.  Feelings of hopelessness or helplessness.  Thoughts of suicide or the desire to harm yourself (suicidal ideation).  Loss of touch with reality (psychotic symptoms). Seeing or hearing things that are not real (hallucinations) or having false beliefs about your life or the people around you (delusions and paranoia). DIAGNOSIS  The diagnosis of clinical depression is usually based on how bad the symptoms are and how long they have lasted. Your health care provider will also ask you questions about your medical history and substance use to find out if physical illness, use of prescription medicine, or substance abuse is causing your depression. Your health care provider may also order blood tests. TREATMENT  Often, normal sadness and normal grief do not require treatment. However, sometimes antidepressant medicine is given for bereavement to ease the depressive symptoms until they resolve. The treatment for clinical depression depends on how bad the symptoms are but often includes antidepressant medicine, counseling with a mental health professional, or both. Your health care provider will help to determine what treatment is best for you. Depression caused by physical illness usually goes away with appropriate medical treatment of the illness. If prescription medicine is causing depression, talk with your health care provider about stopping the medicine, decreasing the dose, or changing to another medicine. Depression caused by the abuse of alcohol or illicit drugs goes away when you stop using these substances. Some adults need professional help in order to stop drinking or using drugs. SEEK IMMEDIATE  MEDICAL CARE IF:  You have thoughts about hurting yourself or others.  You lose touch  with reality (have psychotic symptoms).  You are taking medicine for depression and have a serious side effect. FOR MORE INFORMATION  National Alliance on Mental Illness: www.nami.[employer] of Mental Health: http://www.maynard.net/ Document Released: 05/11/2000 Document Revised: 09/28/2013 Document Reviewed: 08/13/2011 [facility] Patient Information 2015 [place], [place]. This information is not intended to replace advice given to you by your health care provider. Make sure you discuss any questions you have with your health care provider. Obesity Obesity is defined as having too much total body fat and a body mass index (BMI) of 30 or more. BMI is an estimate of body fat and is calculated from your height and weight. Obesity happens when you consume more calories than you can burn by exercising or performing daily physical tasks. Prolonged obesity can cause major illnesses or emergencies, such as:   Stroke.  Heart disease.  Diabetes.  Cancer.  Arthritis.  High blood pressure (hypertension).  High cholesterol.  Sleep apnea.  Erectile dysfunction.  Infertility problems. CAUSES   Regularly eating unhealthy foods.  Physical inactivity.  Certain disorders, such as an underactive thyroid (hypothyroidism), Cushing's syndrome, and polycystic ovarian syndrome.  Certain medicines, such as steroids, some depression medicines, and antipsychotics.  Genetics.  Lack of sleep. DIAGNOSIS  A health care provider can diagnose obesity after calculating your BMI. Obesity will be diagnosed if your BMI is 30 or higher.  There are other methods of measuring obesity levels. Some other methods include measuring your skinfold thickness, your waist circumference, and comparing your hip circumference to your waist circumference. TREATMENT  A healthy treatment program includes some or all of the following:  Long-term dietary changes.  Exercise and physical activity.  Behavioral and  lifestyle changes.  Medicine only under the supervision of your health care provider. Medicines may help, but only if they are used with diet and exercise programs. An unhealthy treatment program includes:  Fasting.  Fad diets.  Supplements and drugs. These choices do not succeed in long-term weight control.  HOME CARE INSTRUCTIONS   Exercise and perform physical activity as directed by your health care provider. To increase physical activity, try the following:  Use stairs instead of elevators.  Park farther away from store entrances.  Garden, bike, or walk instead of watching television or using the computer.  Eat healthy, low-calorie foods and drinks on a regular basis. Eat more fruits and vegetables. Use low-calorie cookbooks or take healthy cooking classes.  Limit fast food, sweets, and processed snack foods.  Eat smaller portions.  Keep a daily journal of everything you eat. There are many free websites to help you with this. It may be helpful to measure your foods so you can determine if you are eating the correct portion sizes.  Avoid drinking alcohol. Drink more water and drinks without calories.  Take vitamins and supplements only as recommended by your health care provider.  Weight-loss support groups, Government social research officer, counselors, and stress reduction education can also be very helpful. SEEK IMMEDIATE MEDICAL CARE IF:  You have chest pain or tightness.  You have trouble breathing or feel short of breath.  You have weakness or leg numbness.  You feel confused or have trouble talking.  You have sudden changes in your vision. MAKE SURE YOU:  Understand these instructions.  Will watch your condition.  Will get help right away if you are not doing well or get worse. Document  Released: 06/21/2004 Document Revised: 09/28/2013 Document Reviewed: 06/20/2011 [facility] Patient Information 2015 [place], [place]. This information is not intended to replace  advice given to you by your health care provider. Make sure you discuss any questions you have with your health care provider.

## 2015-02-03 NOTE — Progress Notes (Signed)
Subjective:    Patient ID: Frances Dudley, female    DOB: 03-26-1986, 29 y.o.   MRN: [id number]  HPI [name], a 29 year old female with a history of chronic pain, multiple sclerosis, depression, and fibromyalgia presents for a follow-up of depression. Patient also complaining of worsening symptoms of depression. She reports feelings of hopelessness and anhedonia have improved since increasing Celexa to 20 mg.  . She denies current suicidal and homicidal plan or intent.   Risk factors include: a previous episode of depression . Denies shortness of breath, sleep apnea, snoring, chest pain, nausea, vomiting or diarrhea.    Past Medical History  Diagnosis Date  . Depression   . Multiple sclerosis   . TB lung, latent   . Migraine   . Fibromyalgia 04/12/2014  . Chronic migraine without aura or status migrainosus 04/12/2014     Medication List       This list is accurate as of: 02/03/15  2:34 PM.  Always use your most recent med list.               ACETAMINOPHEN-BUTALBITAL 50-325 MG Tabs  Take 50-325 mg by mouth every 8 (eight) hours.     Botulinum Toxin Type A 200 UNITS Solr  Commonly known as:  BOTOX  Inject 100 Units as directed once.     citalopram 20 MG tablet  Commonly known as:  CELEXA  TAKE 1 TABLET (20 MG TOTAL) BY MOUTH DAILY.     diazepam 2 MG tablet  Commonly known as:  VALIUM  Take 1 tablet (2 mg total) by mouth 2 (two) times daily.     fluticasone 50 MCG/ACT nasal spray  Commonly known as:  FLONASE  Place 2 sprays into both nostrils daily. X 14 days     HYDROcodone-acetaminophen 5-325 MG per tablet  Commonly known as:  NORCO/VICODIN  Take 1 tablet by mouth every 6 (six) hours as needed for moderate pain.     ibuprofen 800 MG tablet  Commonly known as:  ADVIL,MOTRIN  Take 800 mg by mouth 2 (two) times daily.     indomethacin 75 MG CR capsule  Commonly known as:  INDOCIN SR  Take 1 capsule (75 mg total) by mouth 2 (two) times daily with a  meal.     isoniazid 300 MG tablet  Commonly known as:  NYDRAZID  Take 1 tablet (300 mg total) by mouth at bedtime.     loratadine 10 MG tablet  Commonly known as:  CLARITIN  Take 1 tablet (10 mg total) by mouth daily.     LYRICA 100 MG capsule  Generic drug:  pregabalin  TAKE ONE CAPSULE BY MOUTH TWICE A DAY     ondansetron 4 MG tablet  Commonly known as:  ZOFRAN  Take 4 mg by mouth every 8 (eight) hours as needed for nausea or vomiting.     pyridOXINE 100 MG tablet  Commonly known as:  VITAMIN B-6  Take 100 mg by mouth at bedtime.     Vitamin D 2000 UNITS Caps  Take 6,000 Units by mouth daily.       Social History   Social History  . Marital Status: Single    Spouse Name: N/A  . Number of Children: 1  . Years of Education: some colle   Occupational History  . unemployed    Social History Main Topics  . Smoking status: Never Smoker   . Smokeless tobacco: Never Used  . Alcohol  Use: Yes     Comment: occ  . Drug Use: No  . Sexual Activity: Not on file   Other Topics Concern  . Not on file   Social History Narrative   Patient is adopted.   Review of Systems  Constitutional: Positive for fatigue.  Eyes: Negative.   Respiratory: Negative.   Cardiovascular: Negative.   Endocrine: Negative.  Negative for polydipsia, polyphagia and polyuria.  Genitourinary: Negative.   Musculoskeletal: Negative.   Skin: Negative.   Allergic/Immunologic: Negative.   Neurological: Positive for headaches.  Hematological: Negative.   Psychiatric/Behavioral: Positive for decreased concentration.       History of depression       Objective:   Physical Exam  Constitutional: She is oriented to person, place, and time. She appears well-developed and well-nourished.  obesity  HENT:  Head: Normocephalic and atraumatic.  Right Ear: External ear normal.  Left Ear: External ear normal.  Mouth/Throat: Oropharynx is clear and moist.  Eyes: Conjunctivae and EOM are normal. Pupils  are equal, round, and reactive to light.  Neck: Normal range of motion. Neck supple.  Cardiovascular: Normal rate, regular rhythm, normal heart sounds and intact distal pulses.   Pulmonary/Chest: Effort normal and breath sounds normal.  Abdominal: Soft. Bowel sounds are normal.  Musculoskeletal: Normal range of motion.  Neurological: She is alert and oriented to person, place, and time. She has normal reflexes.  Skin: Skin is warm and dry.  Psychiatric: She has a normal mood and affect. Her behavior is normal. Judgment and thought content normal.      BP 121/84 mmHg  Pulse 70  Temp(Src) 97.9 F (36.6 C) (Oral)  Resp 16  Ht 5' 2.5" (1.588 m)  Wt 226 lb (102.513 kg)  BMI 40.65 kg/m2  LMP 02/01/2015  Assessment & Plan:   1. Depression Reviewed PHQ-9. Patient states that she had 3 previous sessions with previous counselor. She feels that she has had any progress with current counselor. Will send referral for counselor.  She denies any suicidal or homicidal ideations.  - citalopram (CELEXA) 20 MG tablet; TAKE 1 TABLET (20 MG TOTAL) BY MOUTH DAILY.  Dispense: 30 tablet; Refill: 6 - Ambulatory referral to Psychology  2. Multiple sclerosis Patient is under the care of Dr. Anne Hahn, she is on Tysabri treatment monthly. Reviewed previous notes.   3. Fibromyalgia Patient is under the care of pain management. Recommend that patient continue as scheduled.   4. Obesity, morbid Recommend a lowfat, low carbohydrate diet divided over 6 small meals. Increase water intake to 6-8 glasses per day.   5. Intractable chronic migraine without aura and without status migrainosus  Patient is currently under the care of Dr. Anne Hahn for daily headaches. She is currently receiving Botox injections. She states that she has not had relief. Patient scheduled to follow up in 12 weeks.     RTC: RTC in 6 months for depression  Lateya Dauria M, FNP

## 2015-02-18 ENCOUNTER — Encounter (HOSPITAL_COMMUNITY): Payer: Medicaid Other

## 2015-02-21 ENCOUNTER — Other Ambulatory Visit: Payer: Self-pay | Admitting: Internal Medicine

## 2015-02-22 ENCOUNTER — Encounter (HOSPITAL_COMMUNITY)
Admission: RE | Admit: 2015-02-22 | Discharge: 2015-02-22 | Disposition: A | Discharge status: Home / Self Care | Payer: Medicaid Other | Source: Ambulatory Visit | Attending: Neurology | Admitting: Neurology

## 2015-02-22 ENCOUNTER — Encounter (HOSPITAL_COMMUNITY): Payer: Self-pay

## 2015-02-22 VITALS — BP 118/76 | HR 78 | Temp 98.2°F | Resp 16

## 2015-02-22 DIAGNOSIS — G35 Multiple sclerosis: Secondary | ICD-10-CM | POA: Insufficient documentation

## 2015-02-22 MED ORDER — LORATADINE 10 MG PO TABS: 10.0000 mg | ORAL_TABLET | ORAL | Status: DC

## 2015-02-22 MED ORDER — SODIUM CHLORIDE 0.9 % IV SOLN
INTRAVENOUS | Status: AC
  Administered 2015-02-22: 09:00:00 via INTRAVENOUS

## 2015-02-22 MED ORDER — ACETAMINOPHEN 325 MG PO TABS: 650.0000 mg | ORAL_TABLET | ORAL | Status: DC

## 2015-02-22 MED ORDER — SODIUM CHLORIDE 0.9 % IV SOLN
300.0000 mg | INTRAVENOUS | Status: DC
  Administered 2015-02-22: 300 mg via INTRAVENOUS
  Filled 2015-02-22: qty 15

## 2015-02-22 MED ORDER — SODIUM CHLORIDE 0.9 % IV SOLN: INTRAVENOUS | Status: DC

## 2015-02-22 MED ORDER — LORATADINE 10 MG PO TABS
10.0000 mg | ORAL_TABLET | ORAL | Status: AC
  Administered 2015-02-22: 10 mg via ORAL
  Filled 2015-02-22: qty 1

## 2015-02-22 MED ORDER — ACETAMINOPHEN 325 MG PO TABS
650.0000 mg | ORAL_TABLET | ORAL | Status: AC
  Administered 2015-02-22: 650 mg via ORAL
  Filled 2015-02-22: qty 2

## 2015-02-22 NOTE — Progress Notes (Signed)
Upon cleansing skin for IV site skin red under where CHG (Chlorhexidine Gluconate) wipe was used. Pt stated mild itching localized to that area (which cleared after 10 minutes). Added under allergies and recommend to use alcohol wipe instead of CHG for cleansing prior to IV insertion.   Patient had Tysabri infusion today and tolerated well. Reminded patient of recommended observation time of 1 hour prior to completion of infusion. Patient stayed 45 minutes post infusion. She is aware to call MD for any problems or questions or call 911 for emergencies.

## 2015-02-22 NOTE — Addendum Note (Signed)
Encounter addended by: Weston Anna, RN on: 02/22/2015 10:17 AM<BR>     Documentation filed: Normajean Glasgow VN

## 2015-02-24 ENCOUNTER — Telehealth: Payer: Self-pay | Admitting: Neurology

## 2015-02-24 NOTE — Telephone Encounter (Signed)
I called the patient. Appointment scheduled 10/18 at 8 AM.

## 2015-02-24 NOTE — Telephone Encounter (Signed)
Pt called requesting to schedule botox injection. Last injection was 12/08/14. Please work her in and let Duwayne Heck know the date. Thanks! Patient can be reached at [phone].

## 2015-03-15 ENCOUNTER — Ambulatory Visit (INDEPENDENT_AMBULATORY_CARE_PROVIDER_SITE_OTHER): Payer: Medicaid Other | Admitting: Neurology

## 2015-03-15 ENCOUNTER — Encounter: Payer: Self-pay | Admitting: Neurology

## 2015-03-15 VITALS — BP 120/75 | HR 92

## 2015-03-15 DIAGNOSIS — G43719 Chronic migraine without aura, intractable, without status migrainosus: Secondary | ICD-10-CM

## 2015-03-15 DIAGNOSIS — M797 Fibromyalgia: Secondary | ICD-10-CM

## 2015-03-15 DIAGNOSIS — G35 Multiple sclerosis: Secondary | ICD-10-CM

## 2015-03-15 NOTE — Procedures (Signed)
     BOTOX PROCEDURE NOTE FOR MIGRAINE HEADACHE   HISTORY: Frances Dudley is a 29 year old patient with a history of multiple sclerosis and intractable migraine headache. She comes in for Botox injections today, she indicates that the injections continuing to be helpful, she will have a significant reduction in her headache frequency getting only one headache a week after the injection for about 2 months, then the headache frequency increases again and she will get 2 or 3 headaches a week that are more severe. She is on Tysabri for her multiple sclerosis, no other significant changes in her clinical condition have been noted.   Description of procedure:  The patient was placed in a sitting position. The standard protocol was used for Botox as follows, with 5 units of Botox injected at each site:   -Procerus muscle, midline injection  -Corrugator muscle, bilateral injection  -Frontalis muscle, bilateral injection, with 2 sites each side, medial injection was performed in the upper one third of the frontalis muscle, in the region vertical from the medial inferior edge of the superior orbital rim. The lateral injection was again in the upper one third of the forehead vertically above the lateral limbus of the cornea, 1.5 cm lateral to the medial injection site.  -Temporalis muscle injection, 4 sites, bilaterally. The first injection was 3 cm above the tragus of the ear, second injection site was 1.5 cm to 3 cm up from the first injection site in line with the tragus of the ear. The third injection site was 1.5-3 cm forward between the first 2 injection sites. The fourth injection site was 1.5 cm posterior to the second injection site.  -Occipitalis muscle injection, 3 sites, bilaterally. The first injection was done one half way between the occipital protuberance and the tip of the mastoid process behind the ear. The second injection site was done lateral and superior to the first, 1  fingerbreadth from the first injection. The third injection site was 1 fingerbreadth superiorly and medially from the first injection site.  -Cervical paraspinal muscle injection, 2 sites, bilateral, the first injection site was 1 cm from the midline of the cervical spine, 3 cm inferior to the lower border of the occipital protuberance. The second injection site was 1.5 cm superiorly and laterally to the first injection site.  -Trapezius muscle injection was performed at 3 sites, bilaterally. The first injection site was in the upper trapezius muscle halfway between the inflection point of the neck, and the acromion. The second injection site was one half way between the acromion and the first injection site. The third injection was done between the first injection site and the inflection point of the neck.   A 200 unit bottle of Botox was used, 155 units were injected, the rest of the Botox was wasted. The patient tolerated the procedure well, there were no complications of the above procedure.  Botox NDC 2585-2778-24 Lot number [id number] Expiration date May 2019

## 2015-03-17 ENCOUNTER — Ambulatory Visit: Payer: Self-pay | Admitting: Neurology

## 2015-03-18 ENCOUNTER — Encounter (HOSPITAL_COMMUNITY): Payer: Medicaid Other

## 2015-03-22 ENCOUNTER — Telehealth: Payer: Self-pay | Admitting: Neurology

## 2015-03-22 ENCOUNTER — Encounter (HOSPITAL_COMMUNITY)
Admission: RE | Admit: 2015-03-22 | Discharge: 2015-03-22 | Disposition: A | Discharge status: Home / Self Care | Payer: Medicaid Other | Source: Ambulatory Visit | Attending: Neurology | Admitting: Neurology

## 2015-03-22 ENCOUNTER — Encounter (HOSPITAL_COMMUNITY): Payer: Self-pay

## 2015-03-22 ENCOUNTER — Ambulatory Visit (INDEPENDENT_AMBULATORY_CARE_PROVIDER_SITE_OTHER): Payer: Medicaid Other

## 2015-03-22 VITALS — BP 108/73 | HR 80 | Temp 98.0°F | Resp 16 | Ht 62.5 in | Wt 224.0 lb

## 2015-03-22 DIAGNOSIS — G35 Multiple sclerosis: Secondary | ICD-10-CM | POA: Diagnosis present

## 2015-03-22 DIAGNOSIS — Z23 Encounter for immunization: Secondary | ICD-10-CM | POA: Diagnosis not present

## 2015-03-22 MED ORDER — LORATADINE 10 MG PO TABS
10.0000 mg | ORAL_TABLET | ORAL | Status: AC
  Administered 2015-03-22: 10 mg via ORAL
  Filled 2015-03-22: qty 1

## 2015-03-22 MED ORDER — SODIUM CHLORIDE 0.9 % IV SOLN
300.0000 mg | INTRAVENOUS | Status: DC
  Administered 2015-03-22: 300 mg via INTRAVENOUS
  Filled 2015-03-22: qty 15

## 2015-03-22 MED ORDER — EPINEPHRINE 0.3 MG/0.3ML IJ SOAJ
0.3000 mg | Freq: Once | INTRAMUSCULAR | Status: AC | PRN
  Filled 2015-03-22: qty 0.6

## 2015-03-22 MED ORDER — SODIUM CHLORIDE 0.9 % IV SOLN
INTRAVENOUS | Status: AC
  Administered 2015-03-22: 250 mL via INTRAVENOUS

## 2015-03-22 MED ORDER — LORAZEPAM 0.5 MG PO TABS: 0.5000 mg | ORAL_TABLET | Freq: Two times a day (BID) | ORAL | Status: DC

## 2015-03-22 MED ORDER — ACETAMINOPHEN 325 MG PO TABS
650.0000 mg | ORAL_TABLET | ORAL | Status: AC
Start: 2015-03-22 — End: 2015-03-22
  Administered 2015-03-22: 650 mg via ORAL
  Filled 2015-03-22: qty 2

## 2015-03-22 NOTE — Telephone Encounter (Signed)
Pt called and states that Dr. Anne Hahn advised her to hold off on taking her diazepam (VALIUM) 2 MG tablet because it was making her sick, however she states that her migraines have come back. Please call and advise [phone]

## 2015-03-22 NOTE — Telephone Encounter (Signed)
I called the patient. She stated that the Valium seemed to be helping with her migraines but it made her sick. Once she stopped it one week ago, her migraines came back and she wasn't sick anymore. She would like to know if there is anything else she can take for her migraines.

## 2015-03-22 NOTE — Telephone Encounter (Signed)
I called patient. The patient indicates that the diazepam helped the headaches, but cause nausea. She has come off the medication, the headaches are back, the nausea is gone. I will call in Ativan to see if this works better.

## 2015-03-24 ENCOUNTER — Encounter (HOSPITAL_COMMUNITY): Payer: Medicaid Other

## 2015-03-31 NOTE — Telephone Encounter (Signed)
I called the patient. Appointment scheduled 11-10.

## 2015-03-31 NOTE — Addendum Note (Signed)
Addended by: Stephanie Acre on: 03/31/2015 04:48 PM   Modules accepted: Orders, Medications

## 2015-03-31 NOTE — Telephone Encounter (Signed)
Patient called back to advise she has been taking Ativan since the 1st and since then has had severe headaches, itching, indigestion, belching, heartburn, vomiting, heart racing, ears ringing.

## 2015-03-31 NOTE — Telephone Encounter (Signed)
I called patient. She is unable to tolerate the Ativan as well. I will try to get an appointment set up so that we can review her medications and see if we can get a medication regimen that is more effective.

## 2015-04-06 ENCOUNTER — Telehealth: Payer: Self-pay | Admitting: Internal Medicine

## 2015-04-06 NOTE — Telephone Encounter (Signed)
Received medical records request from [employer]. Forwarded to [employer]. 

## 2015-04-07 ENCOUNTER — Ambulatory Visit (INDEPENDENT_AMBULATORY_CARE_PROVIDER_SITE_OTHER): Payer: Medicaid Other | Admitting: Neurology

## 2015-04-07 ENCOUNTER — Encounter: Payer: Self-pay | Admitting: Neurology

## 2015-04-07 VITALS — BP 103/66 | HR 91 | Ht 62.5 in | Wt 232.5 lb

## 2015-04-07 DIAGNOSIS — G43719 Chronic migraine without aura, intractable, without status migrainosus: Secondary | ICD-10-CM | POA: Diagnosis not present

## 2015-04-07 DIAGNOSIS — M797 Fibromyalgia: Secondary | ICD-10-CM | POA: Diagnosis not present

## 2015-04-07 DIAGNOSIS — Z5181 Encounter for therapeutic drug level monitoring: Secondary | ICD-10-CM | POA: Diagnosis not present

## 2015-04-07 DIAGNOSIS — G479 Sleep disorder, unspecified: Secondary | ICD-10-CM

## 2015-04-07 DIAGNOSIS — G35 Multiple sclerosis: Secondary | ICD-10-CM

## 2015-04-07 MED ORDER — PREGABALIN 50 MG PO CAPS: ORAL_CAPSULE | ORAL | Status: DC

## 2015-04-07 MED ORDER — CITALOPRAM HYDROBROMIDE 40 MG PO TABS: 40.0000 mg | ORAL_TABLET | Freq: Every day | ORAL | Status: DC

## 2015-04-07 NOTE — Progress Notes (Signed)
Reason for visit: Headache  Frances Dudley is an 29 y.o. female  History of present illness:  Frances Dudley is a 29 year old right handed black female with a history of intractable migraine and multiple sclerosis. She comes into the office today with ongoing daily headache. She has been tried on a multitude of medications in the past. The medications have either been ineffective or poorly tolerated. The patient has been on Effexor, amitriptyline, Topamax, Zonegran, propranolol, verapamil, tizanidine, diazepam, Ativan, baclofen, Toradol, gabapentin, Flexeril, and Lyrica. The patient is currently on Celexa. The patient is also getting Botox therapies, the last injection did not help. She reports minimal nausea or vomiting with the headache, she does have photophobia and phonophobia. She reports tinnitus, and dizziness. The headaches are usually better in the morning, worse as the day goes on. She may have some neck discomfort, with no neck stiffness. She is resting fairly well at night, but she snores and she has excessive daytime drowsiness that has developed. She is quite obese. She denies any new symptoms associated with the multiple sclerosis such as numbness, weakness, balance changes, or change in bowel or bladder control. She has not had any visual changes. She is on Tysabri, and she tolerates the medication well. The patient recently had MRI evaluation of the brain in June 2016. She returns for further evaluation.  Past Medical History  Diagnosis Date  . Depression   . Multiple sclerosis (HCC)   . TB lung, latent   . Migraine   . Fibromyalgia 04/12/2014  . Chronic migraine without aura or status migrainosus 04/12/2014    Past Surgical History  Procedure Laterality Date  . None      Family History  Problem Relation Age of Onset  . Adopted: Yes  . Fibromyalgia Mother     Social history:  reports that she has never smoked. She has never used smokeless tobacco. She reports  that she drinks alcohol. She reports that she does not use illicit drugs.    Allergies  Allergen Reactions  . Amitriptyline   . Baclofen     Headache, nausea  . Chlorhexidine Gluconate Itching     CHG wipe causes skin to be red and itchy - use alcohol when starting iv site   . Diazepam Nausea Only  . Sumatriptan Nausea And Vomiting and Other (See Comments)    lockjaw  . Zonegran [Zonisamide]     Intolerance     Medications:  Prior to Admission medications   Medication Sig Start Date End Date Taking? Authorizing Provider  ACETAMINOPHEN-BUTALBITAL 50-325 MG TABS Take 50-325 mg by mouth every 8 (eight) hours.    Historical Provider, MD  Botulinum Toxin Type A (BOTOX) 200 UNITS SOLR Inject 100 Units as directed once. 05/11/14   York Spaniel, MD  Cholecalciferol (VITAMIN D) 2000 UNITS CAPS Take 6,000 Units by mouth daily.     Historical Provider, MD  citalopram (CELEXA) 20 MG tablet TAKE 1 TABLET (20 MG TOTAL) BY MOUTH DAILY. 02/03/15   Massie Maroon, FNP  fluticasone (FLONASE) 50 MCG/ACT nasal spray Place 2 sprays into both nostrils daily. X 14 days 06/22/14   Ria Clock, PA  HYDROcodone-acetaminophen (NORCO/VICODIN) 5-325 MG per tablet Take 1 tablet by mouth every 6 (six) hours as needed for moderate pain.    Historical Provider, MD  ibuprofen (ADVIL,MOTRIN) 800 MG tablet Take 800 mg by mouth 2 (two) times daily.    Historical Provider, MD  indomethacin (INDOCIN SR)  75 MG CR capsule Take 1 capsule (75 mg total) by mouth 2 (two) times daily with a meal. 11/05/14   York Spaniel, MD  isoniazid (NYDRAZID) 300 MG tablet TAKE 1 TABLET (300 MG TOTAL) BY MOUTH AT BEDTIME. 02/21/15   Massie Maroon, FNP  loratadine (CLARITIN) 10 MG tablet Take 1 tablet (10 mg total) by mouth daily. 04/06/14   Massie Maroon, FNP  LYRICA 100 MG capsule TAKE ONE CAPSULE BY MOUTH TWICE A DAY 12/13/14   York Spaniel, MD  natalizumab (TYSABRI) 300 MG/15ML injection Inject into the vein. Every  28 days    Historical Provider, MD  ondansetron (ZOFRAN) 4 MG tablet Take 4 mg by mouth every 8 (eight) hours as needed for nausea or vomiting.    Historical Provider, MD  pyridOXINE (VITAMIN B-6) 100 MG tablet Take 100 mg by mouth at bedtime.     Historical Provider, MD    ROS:  Out of a complete 14 system review of symptoms, the patient complains only of the following symptoms, and all other reviewed systems are negative.  Ringing in the ears Palpitations of the heart Fatigue Nausea Insomnia, daytime sleepiness Joint pain Headache  Blood pressure 103/66, pulse 91, height 5' 2.5" (1.588 m), weight 232 lb 8 oz (105.461 kg).  Physical Exam  General: The patient is alert and cooperative at the time of the examination. The patient is markedly obese.  Skin: No significant peripheral edema is noted.   Neurologic Exam  Mental status: The patient is alert and oriented x 3 at the time of the examination. The patient has apparent normal recent and remote memory, with an apparently normal attention span and concentration ability.   Cranial nerves: Facial symmetry is present. Speech is normal, no aphasia or dysarthria is noted. Extraocular movements are full. Visual fields are full. Pupils are equal, round, and reactive to light. Discs are flat bilaterally.  Motor: The patient has good strength in all 4 extremities.  Sensory examination: Soft touch sensation is symmetric on the face, arms, and legs.  Coordination: The patient has good finger-nose-finger and heel-to-shin bilaterally.  Gait and station: The patient has a normal gait. Tandem gait is normal. Romberg is negative. No drift is seen.  Reflexes: Deep tendon reflexes are symmetric.   Assessment/Plan:  1. Intractable headache  2. Multiple sclerosis  3. Obesity  4. Excessive daytime drowsiness  The patient will be increased on Celexa taking 40 mg a day, the Lyrica will be increased taking 50 mg in the morning and 100  mg in the evening. In the future, we may consider other medication such as Depakote, cyproheptadine, or Seroquel. We will continue the Botox injections for now. The patient will follow-up for her next scheduled visit. We will make a referral for sleep evaluation given the excessive daytime drowsiness, history of snoring, and headache. The patient does not appear to be using excessive caffeinated products during the day.  Marlan Palau MD 04/08/2015 9:29 AM  [facility] [address] [place], [place] [address]  Phone [phone] Fax [phone]

## 2015-04-07 NOTE — Patient Instructions (Addendum)
  We will increase the celexa to 40 mg a day and increase the Lyrica to 50 mg in the morning and 100 mg in the evening. We will get a sleep evaluation.   Migraine Headache A migraine headache is very bad, throbbing pain on one or both sides of your head. Talk to your doctor about what things may bring on (trigger) your migraine headaches. HOME CARE  Only take medicines as told by your doctor.  Lie down in a dark, quiet room when you have a migraine.  Keep a journal to find out if certain things bring on migraine headaches. For example, write down:  What you eat and drink.  How much sleep you get.  Any change to your diet or medicines.  Lessen how much alcohol you drink.  Quit smoking if you smoke.  Get enough sleep.  Lessen any stress in your life.  Keep lights dim if bright lights bother you or make your migraines worse. GET HELP RIGHT AWAY IF:   Your migraine becomes really bad.  You have a fever.  You have a stiff neck.  You have trouble seeing.  Your muscles are weak, or you lose muscle control.  You lose your balance or have trouble walking.  You feel like you will pass out (faint), or you pass out.  You have really bad symptoms that are different than your first symptoms. MAKE SURE YOU:   Understand these instructions.  Will watch your condition.  Will get help right away if you are not doing well or get worse.   This information is not intended to replace advice given to you by your health care provider. Make sure you discuss any questions you have with your health care provider.   Document Released: 02/21/2008 Document Revised: 08/06/2011 Document Reviewed: 01/19/2013 Elsevier Interactive Patient Education [employer]

## 2015-04-08 ENCOUNTER — Telehealth: Payer: Self-pay

## 2015-04-08 LAB — COMPREHENSIVE METABOLIC PANEL
A/G RATIO: 1.7 (ref 1.1–2.5)
ALT: 21 IU/L (ref 0–32)
AST: 29 IU/L (ref 0–40)
Albumin: 4 g/dL (ref 3.5–5.5)
Alkaline Phosphatase: 49 IU/L (ref 39–117)
BUN/Creatinine Ratio: 19 (ref 8–20)
BUN: 10 mg/dL (ref 6–20)
Bilirubin Total: 0.3 mg/dL (ref 0.0–1.2)
CALCIUM: 9.2 mg/dL (ref 8.7–10.2)
CO2: 24 mmol/L (ref 18–29)
CREATININE: 0.54 mg/dL — AB (ref 0.57–1.00)
Chloride: 99 mmol/L (ref 97–106)
GFR, EST AFRICAN AMERICAN: 150 mL/min/{1.73_m2} (ref >59)
GFR, EST NON AFRICAN AMERICAN: 130 mL/min/{1.73_m2} (ref >59)
Globulin, Total: 2.3 g/dL (ref 1.5–4.5)
Glucose: 99 mg/dL (ref 65–99)
POTASSIUM: 3.9 mmol/L (ref 3.5–5.2)
Sodium: 138 mmol/L (ref 136–144)
TOTAL PROTEIN: 6.3 g/dL (ref 6.0–8.5)

## 2015-04-08 LAB — CBC WITH DIFFERENTIAL/PLATELET
Basophils Absolute: 0 10*3/uL (ref 0.0–0.2)
Basos: 0 %
EOS (ABSOLUTE): 0.2 10*3/uL (ref 0.0–0.4)
EOS: 2 %
HEMATOCRIT: 33.1 % — AB (ref 34.0–46.6)
HEMOGLOBIN: 11.1 g/dL (ref 11.1–15.9)
IMMATURE GRANS (ABS): 0 10*3/uL (ref 0.0–0.1)
IMMATURE GRANULOCYTES: 1 %
LYMPHS: 45 %
Lymphocytes Absolute: 3.5 10*3/uL — ABNORMAL HIGH (ref 0.7–3.1)
MCH: 29.4 pg (ref 26.6–33.0)
MCHC: 33.5 g/dL (ref 31.5–35.7)
MCV: 88 fL (ref 79–97)
MONOCYTES: 8 %
Monocytes Absolute: 0.7 10*3/uL (ref 0.1–0.9)
NEUTROS ABS: 3.5 10*3/uL (ref 1.4–7.0)
NEUTROS PCT: 44 %
PLATELETS: 196 10*3/uL (ref 150–379)
RBC: 3.77 x10E6/uL (ref 3.77–5.28)
RDW: 16.4 % — ABNORMAL HIGH (ref 12.3–15.4)
WBC: 7.9 10*3/uL (ref 3.4–10.8)

## 2015-04-08 NOTE — Telephone Encounter (Signed)
Spoke to patient. Gave lab results. Patient verbalized understanding.  

## 2015-04-08 NOTE — Telephone Encounter (Signed)
-----   Message from York Spaniel, MD sent at 04/08/2015 10:52 AM EST -----  The blood work results are unremarkable. Please call the patient.  ----- Message -----    From: [facility] Lab Results In Interface    Sent: 04/08/2015   7:45 AM      To: York Spaniel, MD

## 2015-04-11 ENCOUNTER — Other Ambulatory Visit: Payer: Self-pay | Admitting: Neurology

## 2015-04-11 MED ORDER — INDOMETHACIN ER 75 MG PO CPCR: 75.0000 mg | ORAL_CAPSULE | Freq: Two times a day (BID) | ORAL | Status: DC

## 2015-04-15 ENCOUNTER — Encounter (HOSPITAL_COMMUNITY): Payer: Medicaid Other

## 2015-04-18 ENCOUNTER — Other Ambulatory Visit: Payer: Self-pay | Admitting: Neurology

## 2015-04-18 DIAGNOSIS — G35 Multiple sclerosis: Secondary | ICD-10-CM

## 2015-04-19 ENCOUNTER — Encounter (HOSPITAL_COMMUNITY): Payer: Self-pay

## 2015-04-19 ENCOUNTER — Encounter (HOSPITAL_COMMUNITY)
Admission: RE | Admit: 2015-04-19 | Discharge: 2015-04-19 | Disposition: A | Discharge status: Home / Self Care | Payer: Medicaid Other | Source: Ambulatory Visit | Attending: Neurology | Admitting: Neurology

## 2015-04-19 ENCOUNTER — Ambulatory Visit (INDEPENDENT_AMBULATORY_CARE_PROVIDER_SITE_OTHER): Payer: Medicaid Other | Admitting: Neurology

## 2015-04-19 ENCOUNTER — Encounter: Payer: Self-pay | Admitting: Neurology

## 2015-04-19 VITALS — BP 119/83 | HR 75 | Temp 97.8°F | Resp 16

## 2015-04-19 VITALS — BP 110/72 | HR 94 | Resp 20 | Ht 62.5 in | Wt 232.0 lb

## 2015-04-19 DIAGNOSIS — R519 Headache, unspecified: Secondary | ICD-10-CM

## 2015-04-19 DIAGNOSIS — R0683 Snoring: Secondary | ICD-10-CM

## 2015-04-19 DIAGNOSIS — R51 Headache: Secondary | ICD-10-CM

## 2015-04-19 DIAGNOSIS — G4719 Other hypersomnia: Secondary | ICD-10-CM | POA: Diagnosis not present

## 2015-04-19 DIAGNOSIS — G35 Multiple sclerosis: Secondary | ICD-10-CM

## 2015-04-19 MED ORDER — SODIUM CHLORIDE 0.9 % IV SOLN
300.0000 mg | INTRAVENOUS | Status: DC
  Administered 2015-04-19: 300 mg via INTRAVENOUS
  Filled 2015-04-19: qty 15

## 2015-04-19 MED ORDER — SODIUM CHLORIDE 0.9 % IV SOLN
INTRAVENOUS | Status: DC
  Administered 2015-04-19: 08:00:00 via INTRAVENOUS

## 2015-04-19 MED ORDER — LORATADINE 10 MG PO TABS
10.0000 mg | ORAL_TABLET | ORAL | Status: DC
  Administered 2015-04-19: 10 mg via ORAL
  Filled 2015-04-19: qty 1

## 2015-04-19 MED ORDER — ACETAMINOPHEN 325 MG PO TABS
650.0000 mg | ORAL_TABLET | ORAL | Status: DC
  Administered 2015-04-19: 650 mg via ORAL
  Filled 2015-04-19: qty 2

## 2015-04-19 NOTE — Progress Notes (Signed)
SLEEP MEDICINE CLINIC   Provider:  Melvyn Novas, M D  Referring Provider: York Spaniel, MD Primary Care Physician:  Altha Harm., MD  Chief Complaint  Patient presents with  . New Patient (Initial Visit)    snoring, morning headaches, Dr Anne Hahn referral, rm 11, with girlfriend Racheal Patches    HPI:  Frances Dudley is a 29 y.o. female , seen here as a referral from Dr. Anne Hahn for a sleep consultation,  Frances Dudley is seen here today upon request of her primary neurologist, who follows her for multiple sclerosis and a history of intractable migraines. Last time she was seen on 04-08-15 she had presented with an ongoing daily headache. Dr. Anne Hahn listed a multitude of medications that have been tried in the past and had either been ineffective or very poorly tolerated. These are Effexor, Elavil, Topamax, Zonegran, propanolol, verapamil, tizanidine, diazepam, Ativan, baclofen, Toradol, gabapentin, Flexeril and Lyrica.  She is currently taking Celexa.  She is also receiving Botox therapy but the last injection was reported not to help. She will reports rather non-prominent nausea with headaches but she does have a sensitivity to light and sounds.  She states that her headaches are not sleep related she goes to bed with a headache and will wake up with a headache. Her headaches are present in the morning when she wakes up but do not wake her. She has not reported cluster headaches. She cannot recall and she was last time headache free for a period of 2-3 days. She reports weight gain , is obese, she snores- her partner witnessed crescendo snoring, but not apnea. She is sleep talking. She feels fatigued.   Sleep habits are as follows: Her bedtime is at 1 AM ' may be', sometimes she will fall asleep at 1:30 or 2 AM. She spends a lot of time in her bedroom.  The bedroom is coll, quiet and dark. She has to arise at 6 AM, at that time woken by an alarm. She has trouble getting up and  feels not restored nor refreshed , not ready to start her day. Some times she has "wierd dreams" but not nightly.  She only averages about 5 hours of nocturnal sleep this way. She sleeps in the daytime, takes naps. She reports going back to bed between 8 and 9 AM and usually falling asleep for another couple of hours rises again at 2 PM. There is really no sleep hygiene. She usually does not take a breakfast, eats lunch at various hours and sometimes not at all, dinner around 5 PM. Frances Dudley is at this time not gainfully employed and does not have the obligation to adhere to an external rhythm. She leaves the home to get her daughter to and from the bus, goes shopping. But she has no regular exercise program. No Routines.  If she has a headache she will drink a soda as caffeine seems to help with the relief. Very rarely will she drink coffee or iced tea. She's a nonsmoker and nondrinker.    Sleep medical history and family sleep history: His Newmann was adopted and does not have a biological medical history- her biological mother has used a CPAP machine. Social history: unemployed, HS graduate.  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 She has one daughter.   Review of Systems: Out of a complete 14 system review, the patient complains of only the following symptoms, and all other reviewed systems are negative. She is not  experienced sleep paralysis but excessive daytime fatigue and expressed a concern about excessive daytime sleepiness. No dream intrusion or hypnagogic or hypnopompic hallucinations further endorsed chronic migraines, blurred vision snoring easy bruising fatigue ringing in the ears skin sensitivity numbness weakness dizziness, sleepiness, insomnia, depression, anxiety, oversleeping, being sleep deprived, decreased energy and desinterest in activities. Epworth score 16 , Fatigue severity score 62  , depression score see attached.    Social History   Social  History  . Marital Status: Single    Spouse Name: N/A  . Number of Children: 1  . Years of Education: some colle   Occupational History  . unemployed    Social History Main Topics  . Smoking status: Never Smoker   . Smokeless tobacco: Never Used  . Alcohol Use: Yes     Comment: occ  . Drug Use: No  . Sexual Activity: Not on file   Other Topics Concern  . Not on file   Social History Narrative   Patient is adopted.    Family History  Problem Relation Age of Onset  . Adopted: Yes  . Fibromyalgia Mother     Past Medical History  Diagnosis Date  . Depression   . Multiple sclerosis (HCC)   . TB lung, latent   . Migraine   . Fibromyalgia 04/12/2014  . Chronic migraine without aura or status migrainosus 04/12/2014    Past Surgical History  Procedure Laterality Date  . None      Current Outpatient Prescriptions  Medication Sig Dispense Refill  . ACETAMINOPHEN-BUTALBITAL 50-325 MG TABS Take 50-325 mg by mouth every 8 (eight) hours.    . Botulinum Toxin Type A (BOTOX) 200 UNITS SOLR Inject 100 Units as directed once. 2 each 0  . Cholecalciferol (VITAMIN D) 2000 UNITS CAPS Take 6,000 Units by mouth daily.     . citalopram (CELEXA) 40 MG tablet Take 1 tablet (40 mg total) by mouth daily. 30 tablet 3  . fluticasone (FLONASE) 50 MCG/ACT nasal spray Place 2 sprays into both nostrils daily. X 14 days 16 g 0  . ibuprofen (ADVIL,MOTRIN) 800 MG tablet Take 800 mg by mouth 2 (two) times daily.    . indomethacin (INDOCIN SR) 75 MG CR capsule Take 1 capsule (75 mg total) by mouth 2 (two) times daily with a meal. 60 capsule 3  . isoniazid (NYDRAZID) 300 MG tablet TAKE 1 TABLET (300 MG TOTAL) BY MOUTH AT BEDTIME. 30 tablet 3  . loratadine (CLARITIN) 10 MG tablet Take 1 tablet (10 mg total) by mouth daily. 30 tablet 2  . natalizumab (TYSABRI) 300 MG/15ML injection Inject into the vein. Every 28 days    . pregabalin (LYRICA) 50 MG capsule One capsule in the morning and 2 in the evening  90 capsule 2  . pyridOXINE (VITAMIN B-6) 100 MG tablet Take 100 mg by mouth at bedtime.      Current Facility-Administered Medications  Medication Dose Route Frequency Provider Last Rate Last Dose  . botulinum toxin Type A (BOTOX) injection 200 Units  200 Units Intramuscular Once York Spaniel, MD       Facility-Administered Medications Ordered in Other Visits  Medication Dose Route Frequency Provider Last Rate Last Dose  . 0.9 %  sodium chloride infusion   Intravenous Q28 days York Spaniel, MD   Stopped at 04/19/15 0827  . acetaminophen (TYLENOL) tablet 650 mg  650 mg Oral Q28 days York Spaniel, MD   650 mg at 04/19/15 0758  .  loratadine (CLARITIN) tablet 10 mg  10 mg Oral Q28 days York Spaniel, MD   10 mg at 04/19/15 0758  . natalizumab (TYSABRI) 300 mg in sodium chloride 0.9 % 100 mL IVPB  300 mg Intravenous Q28 days York Spaniel, MD   300 mg at 04/19/15 0825    Allergies as of 04/19/2015 - Review Complete 04/19/2015  Allergen Reaction Noted  . Amitriptyline  04/06/2014  . Baclofen  02/02/2015  . Chlorhexidine gluconate Itching 02/22/2015  . Diazepam Nausea Only 03/22/2015  . Sumatriptan Nausea And Vomiting and Other (See Comments) 02/15/2014  . Zonegran [zonisamide]  05/03/2014    Vitals: BP 110/72 mmHg  Pulse 94  Resp 20  Ht 5' 2.5" (1.588 m)  Wt 232 lb (105.235 kg)  BMI 41.73 kg/m2 Last Weight:  Wt Readings from Last 1 Encounters:  04/19/15 232 lb (105.235 kg)   FAO:ZHYQ mass index is 41.73 kg/(m^2).     Last Height:   Ht Readings from Last 1 Encounters:  04/19/15 5' 2.5" (1.588 m)    Physical exam:  General: The patient is awake, alert and appears not in acute distress. The patient is well groomed. Head: Normocephalic, atraumatic. Neck is supple. Mallampati 2,  neck circumference:15.5  Nasal airflow intact, Retrognathia is seen.  Cardiovascular:  Regular rate and rhythm , without  murmurs or carotid bruit, and without distended neck  veins. Respiratory: Lungs are clear to auscultation. Skin:  Without evidence of edema, or rash Trunk:obese Neurologic exam : The patient is awake and alert, oriented to place and time.   Memory subjective  described as intact.  Attention span & concentration ability appears abnormal. Distractable.  Speech is  dysphonic .  Mood and affect are appropriate.  Cranial nerves: No change in taste or smell.  Pupils are equal and briskly reactive to light. Extraocular movements  in vertical and horizontal planes intact and without nystagmus. Visual fields by finger perimetry are intact. Hearing to finger rub intact.   Facial sensation intact to fine touch.  Facial motor strength is symmetric and tongue and uvula move midline. Shoulder shrug was symmetrical.   Motor exam:  Normal tone, muscle bulk and symmetric strength in all extremities.  Deep tendon reflexes: in the  upper and lower extremities are symmetric and intact. Babinski maneuver response is  downgoing.  The patient was advised of the nature of the diagnosed sleep disorder , the treatment options and risks for general a health and wellness arising from not treating the condition.  I spent more than 50 minutes of face to face time with the patient. Greater than 50% of time was spent in counseling and coordination of care. We have discussed the diagnosis and differential and I answered the patient's questions.     Assessment:  After physical and neurologic examination, review of laboratory studies,  Personal review of imaging studies, reports of other /same  Imaging studies ,  Results of polysomnography/ neurophysiology testing and pre-existing records from Dr Anne Hahn and  as far as provided in visit., my assessment is :   1) Frances Dudley describes excessive daytime sleepiness and fatigue, associated with rather free running sleep habits. Her nocturnal sleep time is very limited and she sleeps a great deal during the daytime. She has been  witnessed to snore but her partner has not seen her becoming apneic in her sleep. I will order a sleep study to screen for sleep apnea.   2)This will also contain measurements for CO2 and oxygen.  This the headaches the patient describes are not sleep related date onset on in sleep they do not wake her from sleep - they're present when she goes to sleep and when she wakes up, as well as throughout the day.  These HA would not be considered cluster headaches.   3)A great deal of sleepiness and fatigue can be attributed to chronic inflammatory disorders and autoimmune diseases such as multiple sclerosis.  4) In addition depression and anxiety can play a role, especially in insomnia patients.       Plan:  Treatment plan and additional workup : I recommend for Frances Dudley to undergo a sleep study for screening of sleep apnea. This should be and attended sleep study to be able to measure hypoxemia and hypercapnia as   . In addition I want her to advance her bedtime to before midnight. Goal reported be to get 6 hours of nocturnal sleep.  I would like for her to reduce her daytime naps or limit the duration of such to 15-30 minutes.  Power naps will allow her to gather some energy without detracting from her nocturnal sleep quality.  In addition she should keep her cool, quiet and dark bedroom. Reduce caffeine 2 before lunch time, eat regular meals,  she sees Dr. In Psychistry to address anxiety and depression.     Porfirio Mylar Harman Langhans MD  04/19/2015   CC: York Spaniel, Md [address] [place], [place] [address]

## 2015-04-19 NOTE — Progress Notes (Signed)
Pt stayed 40 min post infusion.  Tysabri infusion uneventful.  Pt informed to call md for questions or concerns.  Pt d/c ambulatory to lobby.

## 2015-04-27 ENCOUNTER — Encounter: Payer: Self-pay | Admitting: Family Medicine

## 2015-04-27 ENCOUNTER — Telehealth: Payer: Self-pay

## 2015-04-27 ENCOUNTER — Ambulatory Visit (INDEPENDENT_AMBULATORY_CARE_PROVIDER_SITE_OTHER): Payer: Medicaid Other | Admitting: Family Medicine

## 2015-04-27 VITALS — BP 105/69 | HR 93 | Temp 97.9°F | Ht 62.5 in | Wt 237.0 lb

## 2015-04-27 DIAGNOSIS — G8929 Other chronic pain: Secondary | ICD-10-CM

## 2015-04-27 DIAGNOSIS — N644 Mastodynia: Secondary | ICD-10-CM

## 2015-04-27 DIAGNOSIS — Z6838 Body mass index (BMI) 38.0-38.9, adult: Secondary | ICD-10-CM | POA: Diagnosis not present

## 2015-04-27 DIAGNOSIS — N926 Irregular menstruation, unspecified: Secondary | ICD-10-CM | POA: Diagnosis not present

## 2015-04-27 DIAGNOSIS — L680 Hirsutism: Secondary | ICD-10-CM

## 2015-04-27 DIAGNOSIS — N6489 Other specified disorders of breast: Secondary | ICD-10-CM | POA: Diagnosis not present

## 2015-04-27 DIAGNOSIS — M549 Dorsalgia, unspecified: Secondary | ICD-10-CM

## 2015-04-27 DIAGNOSIS — R7611 Nonspecific reaction to tuberculin skin test without active tuberculosis: Secondary | ICD-10-CM

## 2015-04-27 DIAGNOSIS — Z227 Latent tuberculosis: Secondary | ICD-10-CM

## 2015-04-27 LAB — POCT URINE PREGNANCY: Preg Test, Ur: NEGATIVE

## 2015-04-27 NOTE — Patient Instructions (Addendum)
Will notify with laboratory results and possibly adding medication intervention.  Will call to ensure that patient can discontinue isoniazid therapy.   Will schedule a pelvic ultrasound.  Polycystic Ovarian Syndrome Polycystic ovarian syndrome (PCOS) is a common hormonal disorder among women of reproductive age. Most women with PCOS grow many small cysts on their ovaries. PCOS can cause problems with your periods and make it difficult to get pregnant. It can also cause an increased risk of miscarriage with pregnancy. If left untreated, PCOS can lead to serious health problems, such as diabetes and heart disease. CAUSES The cause of PCOS is not fully understood, but genetics may be a factor. SIGNS AND SYMPTOMS   Infrequent or no menstrual periods.   Inability to get pregnant (infertility) because of not ovulating.   Increased growth of hair on the face, chest, stomach, back, thumbs, thighs, or toes.   Acne, oily skin, or dandruff.   Pelvic pain.   Weight gain or obesity, usually carrying extra weight around the waist.   Type 2 diabetes.   High cholesterol.   High blood pressure.   Female-pattern baldness or thinning hair.   Patches of thickened and dark brown or black skin on the neck, arms, breasts, or thighs.   Tiny excess flaps of skin (skin tags) in the armpits or neck area.   Excessive snoring and having breathing stop at times while asleep (sleep apnea).   Deepening of the voice.   Gestational diabetes when pregnant.  DIAGNOSIS  There is no single test to diagnose PCOS.   Your health care provider will:   Take a medical history.   Perform a pelvic exam.   Have ultrasonography done.   Check your female and female hormone levels.   Measure glucose or sugar levels in the blood.   Do other blood tests.   If you are producing too many female hormones, your health care provider will make sure it is from PCOS. At the physical exam, your health  care provider will want to evaluate the areas of increased hair growth. Try to allow natural hair growth for a few days before the visit.   During a pelvic exam, the ovaries may be enlarged or swollen because of the increased number of small cysts. This can be seen more easily by using vaginal ultrasonography or screening to examine the ovaries and lining of the uterus (endometrium) for cysts. The uterine lining may become thicker if you have not been having a regular period.  TREATMENT  Because there is no cure for PCOS, it needs to be managed to prevent problems. Treatments are based on your symptoms. Treatment is also based on whether you want to have a baby or whether you need contraception.  Treatment may include:   Progesterone hormone to start a menstrual period.   Birth control pills to make you have regular menstrual periods.   Medicines to make you ovulate, if you want to get pregnant.   Medicines to control your insulin.   Medicine to control your blood pressure.   Medicine and diet to control your high cholesterol and triglycerides in your blood.  Medicine to reduce excessive hair growth.  Surgery, making small holes in the ovary, to decrease the amount of female hormone production. This is done through a long, lighted tube (laparoscope) placed into the pelvis through a tiny incision in the lower abdomen.  HOME CARE INSTRUCTIONS  Only take over-the-counter or prescription medicine as directed by your health care provider.  Pay  attention to the foods you eat and your activity levels. This can help reduce the effects of PCOS.  Keep your weight under control.  Eat foods that are low in carbohydrate and high in fiber.  Exercise regularly. SEEK MEDICAL CARE IF:  Your symptoms do not get better with medicine.  You have new symptoms.   This information is not intended to replace advice given to you by your health care provider. Make sure you discuss any questions  you have with your health care provider.   Document Released: 09/07/2004 Document Revised: 03/04/2013 Document Reviewed: 10/30/2012 Elsevier Interactive Patient Education 2016 [employer] Hirsutism and Masculinization Hirsutism (increased body hair) is the growth of colored (pigmented) thick hair in women. It is most noticeable when it is on the moustache or beard areas. The other common sites are the:  Chest.  Abdomen.  Thighs.  Back. Pubic hair growth may run upward from the usual bikini line to the middle of the abdomen.  Virilism (masculinization) is more extensive than hirsutism. It has extra symptoms. There may be:  Acne.  Oily skin.  Baldness.  Enlargement of the clitoris.  Increased sex drive (libido).  Voice deepening.  Reduced breast size.  Irregular or absent periods.  Aggression. The scalp hair growth may also bald in a typical female pattern. CAUSES  This is caused by too much female sex hormone (androgen) in the body. It can be produced by the ovaries, adrenal glands, and within the skin. Hirsutism is most commonly related to polycystic ovarian syndrome (PCOS). The first signs of increased androgen levels are hirsutism and acne. How sensitive each person is to hormone levels varies greatly. Virilism may result from higher androgen levels. Some women with hirsutism have normal hormone levels. Eventually there may be female pattern balding. These problems are also connected to difficulty in having children (infertility). In addition, both malignant and benign tumors may cause hirsutism such as tumors of the adrenal gland (adenomas or adenocarcinomas) but this is a rare cause. There is also evidence that insulin resistance may cause the androgynism. This problem is treated with some success with a medicine for diabetes (metformin). Causes that come from outside the body (exogenous) may also lead to hirsutism such as intake of androgens by mouth.  Note: Women of  [place], and [place] heritage commonly have facial, abdominal, and thigh hair that is normal for them.  TREATMENT  There are medical treatments that inhibit these conditions, such as:  Combined oral contraceptive pills, if you are not trying to become pregnant.  Medicines that stop the production of hormones (gonadotropins).  Steroids. This may be used if there is evidence of congenital (present since birth) adrenal hyperplasia (abnormal growth of cells).  Metformin for the treatment of virilization, if sensitive to insulin.  Suppression of ovarian hormone production with GnRH analogues (a hormone). They can only be used by themselves for short periods of time. There are a variety of cosmetic treatments. These may be all that you need. They may be effective against occasional problems.  Shaving is the simplest and most effective in the short term. Bleaching is not usually suitable for severe hirsutism.  Plucking, waxing, sugaring (similar to waxing), and depilatory creams are effective. However, on occasion, they can result in skin irritation (inflammation) or infection.  Electrolysis is effective. Your caregiver can help you decide what needs to be done and what course of treatment will be best for you. Your caregiver may refer you to an  endocrinologist. This is a physician who is specialized in the treatment of glandular disorders.   This information is not intended to replace advice given to you by your health care provider. Make sure you discuss any questions you have with your health care provider.   Document Released: 07/23/2001 Document Revised: 06/04/2014 Document Reviewed: 09/08/2008 Elsevier Interactive Patient Education [employer]

## 2015-04-27 NOTE — Telephone Encounter (Signed)
Patient informed of ultrasound scheduled for 04/28/2015 at [facility] at 9:15 with full bladder.

## 2015-04-27 NOTE — Progress Notes (Signed)
Subjective:    Patient ID: Frances Dudley, female    DOB: Jun 27, 1985, 29 y.o.   MRN: [id number]  HPI Frances Dudley, a 29 year old female with a history of MS and depression presents with bilateral breast pain and abnormal menses over the past several months. Patient reports that breasts are painful and often feel "heavy". She states that she notices lumps throughout breast on self breast examination. She also states that she bruises easily over breast tissue. She has had abnormal menses over the past several months. She states that menstrual cycles occur 2-3 times per month. She is having menstrual cycle at present.  Menses vary in terms of length and consistency. She denies nipple discharge, nipple inversion or retraction.  Past Medical History  Diagnosis Date  . Depression   . Multiple sclerosis (HCC)   . TB lung, latent   . Migraine   . Fibromyalgia 04/12/2014  . Chronic migraine without aura or status migrainosus 04/12/2014   Immunization History  Administered Date(s) Administered  . Influenza,inj,Quad PF,36+ Mos 03/22/2015   Social History   Social History  . Marital Status: Single    Spouse Name: N/A  . Number of Children: 1  . Years of Education: some colle   Occupational History  . unemployed    Social History Main Topics  . Smoking status: Never Smoker   . Smokeless tobacco: Never Used  . Alcohol Use: Yes     Comment: occ  . Drug Use: No  . Sexual Activity: Not on file   Other Topics Concern  . Not on file   Social History Narrative   Patient is adopted.   Review of Systems  Constitutional: Positive for unexpected weight change (weight gain). Negative for fever and fatigue.  HENT: Negative.   Eyes: Negative.   Respiratory: Negative.   Cardiovascular: Negative for chest pain, palpitations and leg swelling.       Bilateral breast pain  Endocrine: Negative.  Negative for polydipsia, polyphagia and polyuria.  Genitourinary: Positive for vaginal  bleeding and menstrual problem.  Musculoskeletal: Positive for back pain (chronic upper back pain).  Skin:       Bruises easily over breast tissue  Allergic/Immunologic: Negative.   Neurological: Negative.  Negative for weakness.  Hematological: Bruises/bleeds easily.  Psychiatric/Behavioral: Negative.        Objective:   Physical Exam  Constitutional: She is oriented to person, place, and time. She appears well-nourished.  HENT:  Head: Normocephalic and atraumatic.  Right Ear: External ear normal.  Left Ear: External ear normal.  Mouth/Throat: Oropharynx is clear and moist.  Eyes: Conjunctivae and EOM are normal. Pupils are equal, round, and reactive to light.  Neck: Normal range of motion. Neck supple.  Pulmonary/Chest: Effort normal and breath sounds normal. Right breast exhibits tenderness. Right breast exhibits no inverted nipple, no mass, no nipple discharge and no skin change. Left breast exhibits tenderness. Left breast exhibits no inverted nipple, no mass and no nipple discharge. Breasts are asymmetrical.  Large, Pendulous breast with increase fatty tissue, no nodules palpated, dense, firm and elastic. Tender to palpation. Bruising over breast tissue.   Abdominal: Soft. Bowel sounds are normal.  Musculoskeletal: Normal range of motion.  Neurological: She is alert and oriented to person, place, and time.  Skin: Skin is warm and dry.  Psychiatric: She has a normal mood and affect. Her behavior is normal. Judgment and thought content normal.         BP 105/69 mmHg  Pulse 93  Temp(Src) 97.9 F (36.6 C)  Ht 5' 2.5" (1.588 m)  Wt 237 lb (107.502 kg)  BMI 42.63 kg/m2 Assessment & Plan:  1. Breast pain Patient has heavy, pendulous. Refer to #2. Also, patient  is currently experiencing abnormal menses. I suspect a hormone imbalance, will check hormone levels.   2. Pendulous breast Patient is complaining of chronic back pain. Patient is currently under the care of pain  management. She has pendulous breast with increased fatty tissue. I suspect that large pendulous breast are contributing to upper back strain. Pain intensity has not been mitigated with custom fitted bras. Patient would benefit from a breast reduction. I will send referral for evaluation.  - Ambulatory referral to Plastic Surgery    3. BMI 38.0-38.9,adult Recommend a lowfat, low carbohydrate diet divided over 5-6 small meals, increase water intake to 6-8 glasses, and 150 minutes per week of cardiovascular exercise.   4. Abnormal menses I suspect that Frances Dudley has polycystic ovarian syndrome due to current symptoms. Patient has abnormal menses, hirsutism, and increased weight gain. Will check hormone levels and pelvic ultrasound.  - POCT urine pregnancy - Testosterone, Free, Direct - TSH - FSH/LH - Prolactin - US Pelvis Complete; Future - US Transvaginal Non-OB; Future  5. Female hirsutism - Testosterone, Free, Direct  6. 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 I previously consulted Benjie Karvonen, PA-C at [facility], who indicated that patient was started on Isoniazid 300 mg daily and is to continue for 9 months. She initially started therapy 02/09/2014 and will discontinue in June of 2016. Status was reported in the state of [place] and will not need to be reported in the state of [place] per Dr. Luciana Axe, [facility]. Pt has continued Isoniazid past June, will discontinue therapy and check liver enzymes today.  - COMPLETE METABOLIC PANEL WITH GFR  7. Chronic back pain Patient is complaining of chronic back pain. Patient is currently under the care of pain management. She has pendulous breast with increased fatty tissue. I suspect that large pendulous breast are contributing to upper back strain. Pain intensity has not been mitigated with custom fitted bras. Patient would benefit from a breast reduction. I will send referral for evaluation.     RTC: Will  follow up with patient by phone following pelvic ultrasound. Will schedule a follow up appointment at that time.    Adanely Reynoso M, FNP       The patient was given clear instructions to go to ER or return to medical center if symptoms do not improve, worsen or new problems develop. The patient verbalized understanding. Will notify patient with laboratory results.

## 2015-04-28 ENCOUNTER — Other Ambulatory Visit: Payer: Self-pay | Admitting: Family Medicine

## 2015-04-28 ENCOUNTER — Encounter (HOSPITAL_COMMUNITY): Payer: Medicaid Other

## 2015-04-28 ENCOUNTER — Ambulatory Visit (HOSPITAL_COMMUNITY)
Admission: RE | Admit: 2015-04-28 | Discharge: 2015-04-28 | Disposition: A | Discharge status: Home / Self Care | Payer: Medicaid Other | Source: Ambulatory Visit | Attending: Family Medicine | Admitting: Family Medicine

## 2015-04-28 DIAGNOSIS — N926 Irregular menstruation, unspecified: Secondary | ICD-10-CM | POA: Insufficient documentation

## 2015-04-28 DIAGNOSIS — N83201 Unspecified ovarian cyst, right side: Secondary | ICD-10-CM | POA: Diagnosis not present

## 2015-04-28 LAB — COMPLETE METABOLIC PANEL WITH GFR
ALT: 22 U/L (ref 6–29)
AST: 35 U/L — ABNORMAL HIGH (ref 10–30)
Albumin: 3.8 g/dL (ref 3.6–5.1)
Alkaline Phosphatase: 48 U/L (ref 33–115)
BUN: 11 mg/dL (ref 7–25)
CHLORIDE: 101 mmol/L (ref 98–110)
CO2: 26 mmol/L (ref 20–31)
Calcium: 9 mg/dL (ref 8.6–10.2)
Creat: 0.68 mg/dL (ref 0.50–1.10)
GLUCOSE: 91 mg/dL (ref 65–99)
POTASSIUM: 4 mmol/L (ref 3.5–5.3)
SODIUM: 136 mmol/L (ref 135–146)
TOTAL PROTEIN: 6.1 g/dL (ref 6.1–8.1)
Total Bilirubin: 0.3 mg/dL (ref 0.2–1.2)

## 2015-04-28 LAB — PROLACTIN: PROLACTIN: 21.5 ng/mL

## 2015-04-28 LAB — FSH/LH
FSH: 5.7 m[IU]/mL
LH: 8.5 m[IU]/mL

## 2015-04-28 LAB — TSH: TSH: 2.69 u[IU]/mL (ref 0.350–4.500)

## 2015-05-01 LAB — TESTOSTERONE, FREE, DIRECT
TESTOSTERONE, TOTAL: 43 ng/dL (ref 10.0–55.0)
Testosterone, Free: 0.6 pg/mL (ref 0.0–4.2)

## 2015-05-03 ENCOUNTER — Ambulatory Visit (INDEPENDENT_AMBULATORY_CARE_PROVIDER_SITE_OTHER): Payer: Medicaid Other | Admitting: Neurology

## 2015-05-03 DIAGNOSIS — R0683 Snoring: Secondary | ICD-10-CM

## 2015-05-03 DIAGNOSIS — G4719 Other hypersomnia: Secondary | ICD-10-CM

## 2015-05-03 DIAGNOSIS — G4710 Hypersomnia, unspecified: Secondary | ICD-10-CM | POA: Diagnosis not present

## 2015-05-03 DIAGNOSIS — R51 Headache: Secondary | ICD-10-CM

## 2015-05-03 DIAGNOSIS — R519 Headache, unspecified: Secondary | ICD-10-CM

## 2015-05-04 NOTE — Sleep Study (Signed)
Please see the scanned sleep study interpretation located in the Procedure tab within the Chart Review section. 

## 2015-05-10 ENCOUNTER — Telehealth: Payer: Self-pay

## 2015-05-10 DIAGNOSIS — R0683 Snoring: Secondary | ICD-10-CM

## 2015-05-10 NOTE — Telephone Encounter (Signed)
Spoke to pt regarding her sleep study results. Advised pt that her study does not reveal significant sleep apnea or significant  PLMs resulting in significant sleep disruption. The study only revealed snoring and therefore PAP therapy is not indicated. I advised a referral for a dental evaluation for an oral appliance or an ENT evaluation/procedure is recommended. Pt says she would rather see a dentist to get a mouth guard. Pt was advised to not drive or operate hazardous machinery when sleepy. Pt declined a follow up visit in the sleep clinic. Pt wants a referral to a dentist and I advised her that their office would contact her for an appt. Pt verbalized understanding.

## 2015-05-12 ENCOUNTER — Ambulatory Visit (INDEPENDENT_AMBULATORY_CARE_PROVIDER_SITE_OTHER): Payer: Medicaid Other | Admitting: Obstetrics

## 2015-05-12 ENCOUNTER — Encounter: Payer: Self-pay | Admitting: Obstetrics

## 2015-05-12 ENCOUNTER — Other Ambulatory Visit: Payer: Self-pay | Admitting: Neurology

## 2015-05-12 VITALS — BP 135/81 | HR 84 | Temp 98.6°F | Wt 235.0 lb

## 2015-05-12 DIAGNOSIS — Z0000 Encounter for general adult medical examination without abnormal findings: Secondary | ICD-10-CM | POA: Diagnosis not present

## 2015-05-12 DIAGNOSIS — N939 Abnormal uterine and vaginal bleeding, unspecified: Secondary | ICD-10-CM

## 2015-05-12 DIAGNOSIS — Z01419 Encounter for gynecological examination (general) (routine) without abnormal findings: Secondary | ICD-10-CM | POA: Diagnosis not present

## 2015-05-12 DIAGNOSIS — Z113 Encounter for screening for infections with a predominantly sexual mode of transmission: Secondary | ICD-10-CM

## 2015-05-12 NOTE — Progress Notes (Signed)
Patient ID: Frances Dudley, female   DOB: 1986-05-22, 29 y.o.   MRN: [id number]  Chief Complaint  Patient presents with  . New Patient (Initial Visit)    irregular periods, every 2 weeksx 2 months, some spotting, ovarian cyst    HPI Frances Dudley is a 29 y.o. female.  Irregular periods over past 2 months.  Two small, physiologic ovarian cysts.  Contraception: None.  Trying to conceive.  HPI  Past Medical History  Diagnosis Date  . Depression   . Multiple sclerosis (HCC)   . TB lung, latent   . Migraine   . Fibromyalgia 04/12/2014  . Chronic migraine without aura or status migrainosus 04/12/2014    Past Surgical History  Procedure Laterality Date  . None      Family History  Problem Relation Age of Onset  . Adopted: Yes  . Fibromyalgia Mother     Social History Social History  Substance Use Topics  . Smoking status: Never Smoker   . Smokeless tobacco: Never Used  . Alcohol Use: No     Comment: occ    Allergies  Allergen Reactions  . Amitriptyline   . Baclofen     Headache, nausea  . Chlorhexidine Gluconate Itching     CHG wipe causes skin to be red and itchy - use alcohol when starting iv site   . Diazepam Nausea Only  . Sumatriptan Nausea And Vomiting and Other (See Comments)    lockjaw  . Zonegran [Zonisamide]     Intolerance     Current Outpatient Prescriptions  Medication Sig Dispense Refill  . Botulinum Toxin Type A (BOTOX) 200 UNITS SOLR Inject 100 Units as directed once. 2 each 0  . Cholecalciferol (VITAMIN D) 2000 UNITS CAPS Take 6,000 Units by mouth daily.     . citalopram (CELEXA) 40 MG tablet Take 1 tablet (40 mg total) by mouth daily. 30 tablet 3  . indomethacin (INDOCIN SR) 75 MG CR capsule Take 1 capsule (75 mg total) by mouth 2 (two) times daily with a meal. 60 capsule 3  . natalizumab (TYSABRI) 300 MG/15ML injection Inject into the vein. Every 28 days    . pregabalin (LYRICA) 50 MG capsule One capsule in the morning and 2 in the  evening 90 capsule 2  . pyridOXINE (VITAMIN B-6) 100 MG tablet Take 100 mg by mouth at bedtime.     . ACETAMINOPHEN-BUTALBITAL 50-325 MG TABS Take 50-325 mg by mouth every 8 (eight) hours.    . fluticasone (FLONASE) 50 MCG/ACT nasal spray Place 2 sprays into both nostrils daily. X 14 days 16 g 0  . ibuprofen (ADVIL,MOTRIN) 800 MG tablet Take 800 mg by mouth 2 (two) times daily.    Marland Kitchen isoniazid (NYDRAZID) 300 MG tablet TAKE 1 TABLET (300 MG TOTAL) BY MOUTH AT BEDTIME. 30 tablet 3  . loratadine (CLARITIN) 10 MG tablet Take 1 tablet (10 mg total) by mouth daily. 30 tablet 2   Current Facility-Administered Medications  Medication Dose Route Frequency Provider Last Rate Last Dose  . botulinum toxin Type A (BOTOX) injection 200 Units  200 Units Intramuscular Once York Spaniel, MD        Review of Systems Review of Systems Constitutional: negative for fatigue and weight loss Respiratory: negative for cough and wheezing Cardiovascular: negative for chest pain, fatigue and palpitations Gastrointestinal: negative for abdominal pain and change in bowel habits Genitourinary:negative Integument/breast: negative for nipple discharge Musculoskeletal:negative for myalgias Neurological: negative for gait problems and tremors Behavioral/Psych: negative  for abusive relationship, depression Endocrine: negative for temperature intolerance     Blood pressure 135/81, pulse 84, temperature 98.6 F (37 C), weight 235 lb (106.595 kg), last menstrual period 04/07/2015.  Physical Exam Physical Exam General:   alert  Skin:   no rash or abnormalities  Lungs:   clear to auscultation bilaterally  Heart:   regular rate and rhythm, S1, S2 normal, no murmur, click, rub or gallop  Breasts:   normal without suspicious masses, skin or nipple changes or axillary nodes  Abdomen:  normal findings: no organomegaly, soft, non-tender and no hernia  Pelvis:  External genitalia: normal general appearance Urinary system:  urethral meatus normal and bladder without fullness, nontender Vaginal: normal without tenderness, induration or masses Cervix: normal appearance Adnexa: normal bimanual exam Uterus: anteverted and non-tender, normal size      Data Reviewed Labs Ultrasound  Assessment     AUB.  Probable hormonal imbalance. Preconception counseling. Desired STD testing      Plan    Will follow clinically.  Patient does not want hormonal regulation of cycles. F/U prn   Orders Placed This Encounter  Procedures  . SureSwab, Vaginosis/Vaginitis Plus  . HIV antibody  . Hepatitis B surface antigen  . RPR  . Hepatitis C antibody   No orders of the defined types were placed in this encounter.

## 2015-05-13 ENCOUNTER — Encounter (HOSPITAL_COMMUNITY): Payer: Medicaid Other

## 2015-05-13 LAB — PAP IG W/ RFLX HPV ASCU

## 2015-05-13 LAB — HEPATITIS B SURFACE ANTIGEN: Hepatitis B Surface Ag: NEGATIVE

## 2015-05-13 LAB — HEPATITIS C ANTIBODY: HCV Ab: NEGATIVE

## 2015-05-13 LAB — HIV ANTIBODY (ROUTINE TESTING W REFLEX): HIV 1&2 Ab, 4th Generation: NONREACTIVE

## 2015-05-13 LAB — RPR

## 2015-05-16 LAB — SURESWAB, VAGINOSIS/VAGINITIS PLUS
Atopobium vaginae: NOT DETECTED Log (cells/mL)
C. PARAPSILOSIS, DNA: NOT DETECTED
C. TROPICALIS, DNA: NOT DETECTED
C. albicans, DNA: NOT DETECTED
C. glabrata, DNA: NOT DETECTED
C. trachomatis RNA, TMA: NOT DETECTED
LACTOBACILLUS SPECIES: NOT DETECTED Log (cells/mL)
MEGASPHAERA SPECIES: >8 Log (cells/mL)
N. gonorrhoeae RNA, TMA: NOT DETECTED
T. vaginalis RNA, QL TMA: NOT DETECTED

## 2015-05-17 ENCOUNTER — Other Ambulatory Visit: Payer: Self-pay | Admitting: Obstetrics

## 2015-05-17 ENCOUNTER — Encounter (HOSPITAL_COMMUNITY): Payer: Self-pay

## 2015-05-17 ENCOUNTER — Encounter (HOSPITAL_COMMUNITY)
Admission: RE | Admit: 2015-05-17 | Discharge: 2015-05-17 | Disposition: A | Discharge status: Home / Self Care | Payer: Medicaid Other | Source: Ambulatory Visit | Attending: Neurology | Admitting: Neurology

## 2015-05-17 VITALS — BP 118/80 | HR 78 | Temp 98.1°F | Resp 20 | Ht 62.5 in | Wt 235.0 lb

## 2015-05-17 DIAGNOSIS — B9689 Other specified bacterial agents as the cause of diseases classified elsewhere: Secondary | ICD-10-CM

## 2015-05-17 DIAGNOSIS — G35 Multiple sclerosis: Secondary | ICD-10-CM | POA: Diagnosis not present

## 2015-05-17 DIAGNOSIS — N760 Acute vaginitis: Principal | ICD-10-CM

## 2015-05-17 MED ORDER — SODIUM CHLORIDE 0.9 % IV SOLN
300.0000 mg | INTRAVENOUS | Status: DC
  Administered 2015-05-17: 300 mg via INTRAVENOUS
  Filled 2015-05-17: qty 15

## 2015-05-17 MED ORDER — METRONIDAZOLE 500 MG PO TABS: 500.0000 mg | ORAL_TABLET | Freq: Two times a day (BID) | ORAL | Status: DC

## 2015-05-17 MED ORDER — SODIUM CHLORIDE 0.9 % IV SOLN
INTRAVENOUS | Status: AC
  Administered 2015-05-17: 09:00:00 via INTRAVENOUS

## 2015-05-17 MED ORDER — LORATADINE 10 MG PO TABS
10.0000 mg | ORAL_TABLET | ORAL | Status: DC
  Administered 2015-05-17: 10 mg via ORAL
  Filled 2015-05-17: qty 1

## 2015-05-17 MED ORDER — ACETAMINOPHEN 325 MG PO TABS
650.0000 mg | ORAL_TABLET | ORAL | Status: DC
  Administered 2015-05-17: 650 mg via ORAL
  Filled 2015-05-17: qty 2

## 2015-05-17 NOTE — Progress Notes (Signed)
Post-infusion Tysbri , pt. Stayed 35 minutes post-infusion, pt. To follow-up with doctor with any problems.

## 2015-05-17 NOTE — Discharge Instructions (Signed)

## 2015-05-25 ENCOUNTER — Telehealth: Payer: Self-pay | Admitting: Neurology

## 2015-05-25 NOTE — Telephone Encounter (Signed)
Patient called to request refill of pregabalin (LYRICA) 50 MG capsule, states this medication needs PA and she is running out of medication.

## 2015-05-25 NOTE — Telephone Encounter (Signed)
Ins was contacted.  Spoke with Patty.  Provided clinical info asking for an exception.  Request is being reviewed.  It appears one month plus 2 additional refills was prescribed in Nov, so patient should still have refills on file.    Ins has approved the request for coverage on Lyrica effective until 05/19/2016 Ref # [id number]

## 2015-05-26 ENCOUNTER — Encounter (HOSPITAL_COMMUNITY): Payer: Medicaid Other

## 2015-05-31 ENCOUNTER — Ambulatory Visit: Payer: Medicaid Other | Admitting: Family Medicine

## 2015-06-14 ENCOUNTER — Telehealth: Payer: Self-pay | Admitting: Neurology

## 2015-06-14 ENCOUNTER — Encounter (HOSPITAL_COMMUNITY)
Admission: RE | Admit: 2015-06-14 | Discharge: 2015-06-14 | Disposition: A | Discharge status: Home / Self Care | Payer: Medicaid Other | Source: Ambulatory Visit | Attending: Neurology | Admitting: Neurology

## 2015-06-14 VITALS — BP 106/74 | HR 79 | Temp 97.9°F | Resp 16 | Ht 62.5 in | Wt 232.0 lb

## 2015-06-14 DIAGNOSIS — G35 Multiple sclerosis: Secondary | ICD-10-CM | POA: Diagnosis not present

## 2015-06-14 MED ORDER — NATALIZUMAB 300 MG/15ML IV CONC
300.0000 mg | INTRAVENOUS | Status: DC
  Administered 2015-06-14: 300 mg via INTRAVENOUS
  Filled 2015-06-14: qty 15

## 2015-06-14 MED ORDER — ACETAMINOPHEN 325 MG PO TABS
650.0000 mg | ORAL_TABLET | ORAL | Status: AC
  Administered 2015-06-14: 650 mg via ORAL
  Filled 2015-06-14: qty 2

## 2015-06-14 MED ORDER — SODIUM CHLORIDE 0.9 % IV SOLN
INTRAVENOUS | Status: AC
  Administered 2015-06-14: 10:00:00 via INTRAVENOUS

## 2015-06-14 MED ORDER — LORATADINE 10 MG PO TABS
10.0000 mg | ORAL_TABLET | ORAL | Status: AC
  Administered 2015-06-14: 10 mg via ORAL
  Filled 2015-06-14: qty 1

## 2015-06-14 NOTE — Telephone Encounter (Signed)
Called patient to cancel apt because we are still waiting on approval from [place] [employer]. Frances Dudley is there any way you could get her back on the schedule after I hear from [place] [employer]?

## 2015-06-14 NOTE — Progress Notes (Signed)
Uneventful infusion of TYSABRI. Pt stayed 40 minutes of the suggested TOUCH post infusion observation. Pt will contact neurologist for any questions or concerns

## 2015-06-16 ENCOUNTER — Ambulatory Visit: Payer: Self-pay | Admitting: Neurology

## 2015-06-20 ENCOUNTER — Telehealth: Payer: Self-pay | Admitting: Neurology

## 2015-06-20 NOTE — Telephone Encounter (Signed)
Patient called regarding scheduling BOTOX appointment.

## 2015-06-20 NOTE — Telephone Encounter (Signed)
Patient is ready to be scheduled Frances Dudley. Her medication will be here on 06/22/15. Anytime after that will be great. I spoke with the patient to update her on what was going on and told her to be expecting a call from you.

## 2015-06-20 NOTE — Telephone Encounter (Signed)
Spoke with patient and advised her that [place] [employer] had approved her procedure and Earney Navy RN would call to schedule her.

## 2015-06-21 ENCOUNTER — Ambulatory Visit (INDEPENDENT_AMBULATORY_CARE_PROVIDER_SITE_OTHER): Payer: Medicaid Other | Admitting: Family Medicine

## 2015-06-21 VITALS — BP 113/79 | HR 93 | Temp 98.1°F | Resp 16 | Ht 62.5 in | Wt 226.0 lb

## 2015-06-21 DIAGNOSIS — R1084 Generalized abdominal pain: Secondary | ICD-10-CM

## 2015-06-21 DIAGNOSIS — G35 Multiple sclerosis: Secondary | ICD-10-CM | POA: Diagnosis not present

## 2015-06-21 DIAGNOSIS — R112 Nausea with vomiting, unspecified: Secondary | ICD-10-CM

## 2015-06-21 DIAGNOSIS — G8929 Other chronic pain: Secondary | ICD-10-CM

## 2015-06-21 LAB — CBC WITH DIFFERENTIAL/PLATELET
BASOS ABS: 0 10*3/uL (ref 0.0–0.1)
BASOS PCT: 0 % (ref 0–1)
EOS ABS: 0.2 10*3/uL (ref 0.0–0.7)
Eosinophils Relative: 2 % (ref 0–5)
HCT: 36 % (ref 36.0–46.0)
Hemoglobin: 11.9 g/dL — ABNORMAL LOW (ref 12.0–15.0)
Lymphocytes Relative: 52 % — ABNORMAL HIGH (ref 12–46)
Lymphs Abs: 4.7 10*3/uL — ABNORMAL HIGH (ref 0.7–4.0)
MCH: 28.6 pg (ref 26.0–34.0)
MCHC: 33.1 g/dL (ref 30.0–36.0)
MCV: 86.5 fL (ref 78.0–100.0)
MPV: 10.2 fL (ref 8.6–12.4)
Monocytes Absolute: 0.7 10*3/uL (ref 0.1–1.0)
Monocytes Relative: 8 % (ref 3–12)
NEUTROS ABS: 3.5 10*3/uL (ref 1.7–7.7)
Neutrophils Relative %: 38 % — ABNORMAL LOW (ref 43–77)
PLATELETS: 239 10*3/uL (ref 150–400)
RBC: 4.16 MIL/uL (ref 3.87–5.11)
RDW: 15 % (ref 11.5–15.5)
WBC: 9.1 10*3/uL (ref 4.0–10.5)

## 2015-06-21 LAB — POCT URINALYSIS DIP (DEVICE)
Glucose, UA: NEGATIVE mg/dL
Ketones, ur: 80 mg/dL — AB
Leukocytes, UA: NEGATIVE
NITRITE: NEGATIVE
PH: 5.5 (ref 5.0–8.0)
PROTEIN: NEGATIVE mg/dL
Specific Gravity, Urine: >=1.03 (ref 1.005–1.030)
Urobilinogen, UA: 0.2 mg/dL (ref 0.0–1.0)

## 2015-06-21 LAB — COMPLETE METABOLIC PANEL WITH GFR
ALBUMIN: 4.3 g/dL (ref 3.6–5.1)
ALK PHOS: 47 U/L (ref 33–115)
ALT: 19 U/L (ref 6–29)
AST: 22 U/L (ref 10–30)
BILIRUBIN TOTAL: 0.4 mg/dL (ref 0.2–1.2)
BUN: 10 mg/dL (ref 7–25)
CALCIUM: 9.5 mg/dL (ref 8.6–10.2)
CO2: 24 mmol/L (ref 20–31)
Chloride: 100 mmol/L (ref 98–110)
Creat: 0.8 mg/dL (ref 0.50–1.10)
GFR, Est African American: >89 mL/min (ref >=60)
GFR, Est Non African American: >89 mL/min (ref >=60)
Glucose, Bld: 61 mg/dL — ABNORMAL LOW (ref 65–99)
POTASSIUM: 4 mmol/L (ref 3.5–5.3)
Sodium: 136 mmol/L (ref 135–146)
TOTAL PROTEIN: 7.1 g/dL (ref 6.1–8.1)

## 2015-06-21 MED ORDER — ONDANSETRON HCL 4 MG PO TABS: 4.0000 mg | ORAL_TABLET | Freq: Four times a day (QID) | ORAL | Status: DC | PRN

## 2015-06-21 NOTE — Telephone Encounter (Signed)
Noted  

## 2015-06-21 NOTE — Patient Instructions (Signed)
Food Choices for Gastroesophageal Reflux Disease, Adult When you have gastroesophageal reflux disease (GERD), the foods you eat and your eating habits are very important. Choosing the right foods can help ease the discomfort of GERD. WHAT GENERAL GUIDELINES DO I NEED TO FOLLOW?  Choose fruits, vegetables, whole grains, low-fat dairy products, and low-fat meat, fish, and poultry.  Limit fats such as oils, salad dressings, butter, nuts, and avocado.  Keep a food diary to identify foods that cause symptoms.  Avoid foods that cause reflux. These may be different for different people.  Eat frequent small meals instead of three large meals each day.  Eat your meals slowly, in a relaxed setting.  Limit fried foods.  Cook foods using methods other than frying.  Avoid drinking alcohol.  Avoid drinking large amounts of liquids with your meals.  Avoid bending over or lying down until 2-3 hours after eating. WHAT FOODS ARE NOT RECOMMENDED? The following are some foods and drinks that may worsen your symptoms: Vegetables Tomatoes. Tomato juice. Tomato and spaghetti sauce. Chili peppers. Onion and garlic. Horseradish. Fruits Oranges, grapefruit, and lemon (fruit and juice). Meats High-fat meats, fish, and poultry. This includes hot dogs, ribs, ham, sausage, salami, and bacon. Dairy Whole milk and chocolate milk. Sour cream. Cream. Butter. Ice cream. Cream cheese.  Beverages Coffee and tea, with or without caffeine. Carbonated beverages or energy drinks. Condiments Hot sauce. Barbecue sauce.  Sweets/Desserts Chocolate and cocoa. Donuts. Peppermint and spearmint. Fats and Oils High-fat foods, including [place] fries and potato chips. Other Vinegar. Strong spices, such as black pepper, white pepper, red pepper, cayenne, curry powder, cloves, ginger, and chili powder. The items listed above may not be a complete list of foods and beverages to avoid. Contact your dietitian for more  information.   This information is not intended to replace advice given to you by your health care provider. Make sure you discuss any questions you have with your health care provider.   Document Released: 05/14/2005 Document Revised: 06/04/2014 Document Reviewed: 03/18/2013 Elsevier Interactive Patient Education 2016 Elsevier Inc. Nausea and Vomiting Nausea is a sick feeling that often comes before throwing up (vomiting). Vomiting is a reflex where stomach contents come out of your mouth. Vomiting can cause severe loss of body fluids (dehydration). Children and elderly adults can become dehydrated quickly, especially if they also have diarrhea. Nausea and vomiting are symptoms of a condition or disease. It is important to find the cause of your symptoms. CAUSES   Direct irritation of the stomach lining. This irritation can result from increased acid production (gastroesophageal reflux disease), infection, food poisoning, taking certain medicines (such as nonsteroidal anti-inflammatory drugs), alcohol use, or tobacco use.  Signals from the brain.These signals could be caused by a headache, heat exposure, an inner ear disturbance, increased pressure in the brain from injury, infection, a tumor, or a concussion, pain, emotional stimulus, or metabolic problems.  An obstruction in the gastrointestinal tract (bowel obstruction).  Illnesses such as diabetes, hepatitis, gallbladder problems, appendicitis, kidney problems, cancer, sepsis, atypical symptoms of a heart attack, or eating disorders.  Medical treatments such as chemotherapy and radiation.  Receiving medicine that makes you sleep (general anesthetic) during surgery. DIAGNOSIS Your caregiver may ask for tests to be done if the problems do not improve after a few days. Tests may also be done if symptoms are severe or if the reason for the nausea and vomiting is not clear. Tests may include:  Urine tests.  Blood tests.  Stool  tests.  Cultures (to look for evidence of infection).  X-rays or other imaging studies. Test results can help your caregiver make decisions about treatment or the need for additional tests. TREATMENT You need to stay well hydrated. Drink frequently but in small amounts.You may wish to drink water, sports drinks, clear broth, or eat frozen ice pops or gelatin dessert to help stay hydrated.When you eat, eating slowly may help prevent nausea.There are also some antinausea medicines that may help prevent nausea. HOME CARE INSTRUCTIONS   Take all medicine as directed by your caregiver.  If you do not have an appetite, do not force yourself to eat. However, you must continue to drink fluids.  If you have an appetite, eat a normal diet unless your caregiver tells you differently.  Eat a variety of complex carbohydrates (rice, wheat, potatoes, bread), lean meats, yogurt, fruits, and vegetables.  Avoid high-fat foods because they are more difficult to digest.  Drink enough water and fluids to keep your urine clear or pale yellow.  If you are dehydrated, ask your caregiver for specific rehydration instructions. Signs of dehydration may include:  Severe thirst.  Dry lips and mouth.  Dizziness.  Dark urine.  Decreasing urine frequency and amount.  Confusion.  Rapid breathing or pulse. SEEK IMMEDIATE MEDICAL CARE IF:   You have blood or brown flecks (like coffee grounds) in your vomit.  You have black or bloody stools.  You have a severe headache or stiff neck.  You are confused.  You have severe abdominal pain.  You have chest pain or trouble breathing.  You do not urinate at least once every 8 hours.  You develop cold or clammy skin.  You continue to vomit for longer than 24 to 48 hours.  You have a fever. MAKE SURE YOU:   Understand these instructions.  Will watch your condition.  Will get help right away if you are not doing well or get worse.   This  information is not intended to replace advice given to you by your health care provider. Make sure you discuss any questions you have with your health care provider.   Document Released: 05/14/2005 Document Revised: 08/06/2011 Document Reviewed: 10/11/2010 Elsevier Interactive Patient Education 2016 Elsevier Inc. Clear Liquid Diet A clear liquid diet is a short-term diet that is prescribed to provide the necessary fluid and basic energy you need when you can have nothing else. The clear liquid diet consists of liquids or solids that will become liquid at room temperature. You should be able to see through the liquid. There are many reasons that you may be restricted to clear liquids, such as:  When you have a sudden-onset (acute) condition that occurs before or after surgery.  To help your body slowly get adjusted to food again after a long period when you were unable to have food.  Replacement of fluids when you have a diarrheal disease.  When you are going to have certain exams, such as a colonoscopy, in which instruments are inserted inside your body to look at parts of your digestive system. WHAT CAN I HAVE? A clear liquid diet does not provide all the nutrients you need. It is important to choose a variety of the following items to get as many nutrients as possible:  Vegetable juices that do not have pulp.  Fruit juices and fruit drinks that do not have pulp.  Coffee (regular or decaffeinated), tea, or soda at the discretion of your health care provider.  Clear bouillon,  broth, or strained broth-based soups.  High-protein and flavored gelatins.  Sugar or honey.  Ices or frozen ice pops that do not contain milk. If you are not sure whether you can have certain items, you should ask your health care provider. You may also ask your health care provider if there are any other clear liquid options.   This information is not intended to replace advice given to you by your health care  provider. Make sure you discuss any questions you have with your health care provider.   Document Released: 05/14/2005 Document Revised: 05/19/2013 Document Reviewed: 04/10/2013 Elsevier Interactive Patient Education [employer]

## 2015-06-21 NOTE — Telephone Encounter (Signed)
I called the patient. Appointment scheduled 12/6. 

## 2015-06-21 NOTE — Progress Notes (Signed)
Subjective:    Patient ID: Frances Dudley, female    DOB: [birth year], 30 y.o.   MRN: [id number]  Emesis  This is a new problem. The current episode started 1 to 4 weeks ago. The problem occurs 2 to 4 times per day. The problem has been gradually improving. The emesis has an appearance of stomach contents. There has been no fever. Associated symptoms include abdominal pain. Pertinent negatives include no arthralgias, chest pain, chills, coughing, diarrhea, dizziness, fever, headaches, myalgias, sweats, URI or weight loss. Risk factors include suspect food intake. She has tried nothing for the symptoms.   Past Medical History  Diagnosis Date  . Depression   . Multiple sclerosis (HCC)   . TB lung, latent   . Migraine   . Fibromyalgia 04/12/2014  . Chronic migraine without aura or status migrainosus 04/12/2014   Past Surgical History  Procedure Laterality Date  . None     Allergies  Allergen Reactions  . Amitriptyline   . Baclofen     Headache, nausea  . Chlorhexidine Gluconate Itching     CHG wipe causes skin to be red and itchy - use alcohol when starting iv site   . Diazepam Nausea Only  . Sumatriptan Nausea And Vomiting and Other (See Comments)    lockjaw  . Zonegran [Zonisamide]     Intolerance    Immunization History  Administered Date(s) Administered  . Influenza,inj,Quad PF,36+ Mos 03/22/2015   Review of Systems  Constitutional: Negative.  Negative for fever, chills, weight loss, fatigue and unexpected weight change.  HENT: Negative.   Eyes: Negative.   Respiratory: Negative.  Negative for cough.   Cardiovascular: Negative for chest pain.  Gastrointestinal: Positive for abdominal pain. Negative for nausea, vomiting, diarrhea, constipation, blood in stool and rectal pain.  Endocrine: Negative.   Genitourinary: Negative.   Musculoskeletal: Negative for myalgias and arthralgias.  Allergic/Immunologic: Negative.   Neurological: Negative.  Negative for dizziness  and headaches.  Hematological: Negative.   Psychiatric/Behavioral: Negative.     Marland Kitchen   Physical Exam  Constitutional: She is oriented to person, place, and time. She appears well-developed and well-nourished.  HENT:  Head: Normocephalic and atraumatic.  Right Ear: External ear normal.  Mouth/Throat: Oropharynx is clear and moist.  Eyes: Conjunctivae and EOM are normal. Pupils are equal, round, and reactive to light.  Neck: Normal range of motion. Neck supple.  Cardiovascular: Normal rate, regular rhythm, normal heart sounds and intact distal pulses.   Pulmonary/Chest: Effort normal and breath sounds normal.  Abdominal: Soft. There is tenderness.  Musculoskeletal: Normal range of motion.  Neurological: She is alert and oriented to person, place, and time. She has normal reflexes.  Skin: Skin is warm and dry.  Psychiatric: She has a normal mood and affect. Her behavior is normal. Judgment and thought content normal.      BP 113/79 mmHg  Pulse 93  Temp(Src) 98.1 F (36.7 C) (Oral)  Resp 16  Ht 5' 2.5" (1.588 m)  Wt 226 lb (102.513 kg)  BMI 40.65 kg/m2  LMP 06/08/2015 Assessment & Plan:  1. Non-intractable vomiting with nausea, vomiting of unspecified type Recommend rest and increase fluids. Ice chips and drink small amounts of clear fluids frequently. If you tolerate liquids for 12 hours, eat small amounts of soft foods such as cooked cereal, rice, eggs custard, baked potato and yogurt.  - ondansetron (ZOFRAN) 4 MG tablet; Take 1 tablet (4 mg total) by mouth every 6 (six) hours as needed for nausea  or vomiting.  Dispense: 20 tablet; Refill: 0  2. Generalized abdominal pain - COMPLETE METABOLIC PANEL WITH GFR - CBC with Differential - POCT urinalysis dipstick   RTC: if symptoms persist   The patient was given clear instructions to go to ER or return to medical center if symptoms do not improve, worsen or new problems develop. The patient verbalized understanding. Will notify  patient with laboratory results.  Massie Maroon, FNP

## 2015-06-22 ENCOUNTER — Encounter: Payer: Self-pay | Admitting: Family Medicine

## 2015-06-23 ENCOUNTER — Ambulatory Visit (INDEPENDENT_AMBULATORY_CARE_PROVIDER_SITE_OTHER): Payer: Medicaid Other | Admitting: Neurology

## 2015-06-23 ENCOUNTER — Encounter: Payer: Self-pay | Admitting: Neurology

## 2015-06-23 ENCOUNTER — Telehealth: Payer: Self-pay | Admitting: Neurology

## 2015-06-23 VITALS — BP 107/75 | HR 89

## 2015-06-23 DIAGNOSIS — G43719 Chronic migraine without aura, intractable, without status migrainosus: Secondary | ICD-10-CM | POA: Diagnosis not present

## 2015-06-23 NOTE — Progress Notes (Signed)
Please refer to Botox procedure note. 

## 2015-06-23 NOTE — Telephone Encounter (Signed)
Patient needs to be scheduled for a botox injection in 3 months. I know he likes to see them at 8 and 12. Would you mind working her in?

## 2015-06-23 NOTE — Procedures (Signed)
     BOTOX PROCEDURE NOTE FOR MIGRAINE HEADACHE   HISTORY: Frances Dudley is a 30 year old patient with a history of multiple sclerosis but she also has chronic daily headaches associated with intractable migraine. The patient is coming in for a Botox injection today. She indicates that the Botox injections have offered benefit, they have reduce the severity of the headache making them more tolerable. The patient is not incapacitated with the headaches, but several weeks prior to the next Botox injection, the headaches will worsen again. She still has headaches every day. The patient has chronic nausea as well, she believes that she is losing weight, but she has recently started an [employer].   Description of procedure:  The patient was placed in a sitting position. The standard protocol was used for Botox as follows, with 5 units of Botox injected at each site:   -Procerus muscle, midline injection  -Corrugator muscle, bilateral injection  -Frontalis muscle, bilateral injection, with 2 sites each side, medial injection was performed in the upper one third of the frontalis muscle, in the region vertical from the medial inferior edge of the superior orbital rim. The lateral injection was again in the upper one third of the forehead vertically above the lateral limbus of the cornea, 1.5 cm lateral to the medial injection site.  -Temporalis muscle injection, 4 sites, bilaterally. The first injection was 3 cm above the tragus of the ear, second injection site was 1.5 cm to 3 cm up from the first injection site in line with the tragus of the ear. The third injection site was 1.5-3 cm forward between the first 2 injection sites. The fourth injection site was 1.5 cm posterior to the second injection site.  -Occipitalis muscle injection, 3 sites, bilaterally. The first injection was done one half way between the occipital protuberance and the tip of the mastoid process behind the ear. The second  injection site was done lateral and superior to the first, 1 fingerbreadth from the first injection. The third injection site was 1 fingerbreadth superiorly and medially from the first injection site.  -Cervical paraspinal muscle injection, 2 sites, bilateral, the first injection site was 1 cm from the midline of the cervical spine, 3 cm inferior to the lower border of the occipital protuberance. The second injection site was 1.5 cm superiorly and laterally to the first injection site.  -Trapezius muscle injection was performed at 3 sites, bilaterally. The first injection site was in the upper trapezius muscle halfway between the inflection point of the neck, and the acromion. The second injection site was one half way between the acromion and the first injection site. The third injection was done between the first injection site and the inflection point of the neck.   A 200 unit bottle of Botox was used, 155 units were injected, the rest of the Botox was wasted. The patient tolerated the procedure well, there were no complications of the above procedure.  Botox NDC 6578-4696-29 Lot number [id number] Expiration date September 2019

## 2015-06-24 NOTE — Telephone Encounter (Signed)
I called the patient. Appointment scheduled 4/28 at 12.

## 2015-06-24 NOTE — Telephone Encounter (Signed)
Noted  

## 2015-07-12 ENCOUNTER — Encounter (HOSPITAL_COMMUNITY): Admission: RE | Admit: 2015-07-12 | Payer: Medicaid Other | Source: Ambulatory Visit

## 2015-07-28 ENCOUNTER — Encounter: Payer: Self-pay | Admitting: Family Medicine

## 2015-07-28 ENCOUNTER — Ambulatory Visit (INDEPENDENT_AMBULATORY_CARE_PROVIDER_SITE_OTHER): Payer: Medicaid Other | Admitting: Family Medicine

## 2015-07-28 VITALS — BP 111/80 | HR 99 | Temp 98.2°F | Ht 62.5 in | Wt 228.0 lb

## 2015-07-28 DIAGNOSIS — R0989 Other specified symptoms and signs involving the circulatory and respiratory systems: Secondary | ICD-10-CM

## 2015-07-28 DIAGNOSIS — R05 Cough: Secondary | ICD-10-CM

## 2015-07-28 DIAGNOSIS — R0600 Dyspnea, unspecified: Secondary | ICD-10-CM | POA: Diagnosis not present

## 2015-07-28 DIAGNOSIS — R0689 Other abnormalities of breathing: Secondary | ICD-10-CM

## 2015-07-28 DIAGNOSIS — R0609 Other forms of dyspnea: Secondary | ICD-10-CM | POA: Diagnosis not present

## 2015-07-28 DIAGNOSIS — R058 Other specified cough: Secondary | ICD-10-CM

## 2015-07-28 MED ORDER — BENZONATATE 100 MG PO CAPS: 100.0000 mg | ORAL_CAPSULE | Freq: Three times a day (TID) | ORAL | Status: DC | PRN

## 2015-07-28 MED ORDER — AZITHROMYCIN 250 MG PO TABS: ORAL_TABLET | ORAL | Status: DC

## 2015-07-28 NOTE — Progress Notes (Signed)
Subjective:    Patient ID: Frances Dudley, female    DOB: June 19, 1985, 30 y.o.   MRN: [id number]  Cough This is a new problem. The current episode started 1 to 4 weeks ago (3 weeks ago). The problem has been unchanged. The cough is productive of sputum. Associated symptoms include postnasal drip. Pertinent negatives include no chest pain, chills, ear congestion, fever, headaches, heartburn, rhinorrhea, sore throat, shortness of breath, sweats, weight loss or wheezing. The symptoms are aggravated by lying down. She has tried OTC cough suppressant for the symptoms. There is no history of asthma, bronchiectasis, bronchitis, COPD, emphysema, environmental allergies or pneumonia.  URI  This is a new problem. The current episode started 1 to 4 weeks ago. There has been no fever. The fever has been present for less than 1 day. Associated symptoms include congestion and coughing. Pertinent negatives include no chest pain, diarrhea, dysuria, headaches, joint pain, rhinorrhea, sinus pain, sneezing, sore throat, swollen glands or wheezing. She has tried NSAIDs for the symptoms. The treatment provided no relief.   Past Medical History  Diagnosis Date  . Depression   . Multiple sclerosis (HCC)   . TB lung, latent   . Migraine   . Fibromyalgia 04/12/2014  . Chronic migraine without aura or status migrainosus 04/12/2014   Immunization History  Administered Date(s) Administered  . Influenza,inj,Quad PF,36+ Mos 03/22/2015   Social History   Social History  . Marital Status: Single    Spouse Name: N/A  . Number of Children: 1  . Years of Education: some colle   Occupational History  . unemployed    Social History Main Topics  . Smoking status: Never Smoker   . Smokeless tobacco: Never Used  . Alcohol Use: No     Comment: occ  . Drug Use: No  . Sexual Activity: Yes    Birth Control/ Protection: None   Other Topics Concern  . Not on file   Social History Narrative   Patient is adopted.    Review of Systems  Constitutional: Negative.  Negative for fever, chills and weight loss.  HENT: Positive for congestion and postnasal drip. Negative for rhinorrhea, sneezing and sore throat.   Eyes: Negative.   Respiratory: Positive for cough. Negative for shortness of breath and wheezing.   Cardiovascular: Negative for chest pain.  Gastrointestinal: Negative for heartburn and diarrhea.  Endocrine: Negative.   Genitourinary: Negative.  Negative for dysuria.  Musculoskeletal: Negative.  Negative for joint pain.  Allergic/Immunologic: Negative for environmental allergies.  Neurological: Negative for headaches.  Hematological: Negative.   Psychiatric/Behavioral: Negative.       Objective:   Physical Exam  Constitutional: She is oriented to person, place, and time. She appears well-developed and well-nourished.  HENT:  Head: Normocephalic and atraumatic.  Right Ear: External ear normal.  Left Ear: External ear normal.  Mouth/Throat: Oropharynx is clear and moist.  Eyes: Conjunctivae and EOM are normal. Pupils are equal, round, and reactive to light.  Neck: Normal range of motion. Neck supple.  Cardiovascular: Normal rate, regular rhythm, normal heart sounds and intact distal pulses.   Pulmonary/Chest: Effort normal. She has no decreased breath sounds. She has rhonchi in the right upper field and the left upper field.  Abdominal: Soft. Bowel sounds are normal.  Musculoskeletal: Normal range of motion.  Neurological: She is alert and oriented to person, place, and time. She has normal reflexes.  Skin: Skin is warm and dry.  Psychiatric: She has a normal mood and affect.  Her behavior is normal. Judgment and thought content normal.      BP 111/80 mmHg  Pulse 99  Temp(Src) 98.2 F (36.8 C) (Oral)  Ht 5' 2.5" (1.588 m)  Wt 228 lb (103.42 kg)  BMI 41.01 kg/m2  SpO2 99%  LMP 07/04/2015 (Approximate) Assessment & Plan:  1. Symptoms of upper respiratory infection (URI) -Recommend  increasing fluid intake -use humidifier -Increase  Rest -Practice good handwashing - azithromycin (ZITHROMAX) 250 MG tablet; Take 500 mg today; Take 250 mg on days 2-5  Dispense: 6 tablet; Refill: 0  2. Productive cough Humidify air to prevent drying of respiratory secretions.  - benzonatate (TESSALON) 100 MG capsule; Take 1 capsule (100 mg total) by mouth 3 (three) times daily as needed for cough.  Dispense: 30 capsule; Refill: 0   The patient was given clear instructions to go to ER or return to medical center if symptoms do not improve, worsen or new problems develop. The patient verbalized understanding. Will notify patient with laboratory results.   Massie Maroon, FNP

## 2015-07-28 NOTE — Patient Instructions (Addendum)
Increase fluid intake, rest, and vitamin C intake Also, increase handwashing Breath in warm steam when showeringUpper Respiratory Infection, Adult Most upper respiratory infections (URIs) are a viral infection of the air passages leading to the lungs. A URI affects the nose, throat, and upper air passages. The most common type of URI is nasopharyngitis and is typically referred to as "the common cold." URIs run their course and usually go away on their own. Most of the time, a URI does not require medical attention, but sometimes a bacterial infection in the upper airways can follow a viral infection. This is called a secondary infection. Sinus and middle ear infections are common types of secondary upper respiratory infections. Bacterial pneumonia can also complicate a URI. A URI can worsen asthma and chronic obstructive pulmonary disease (COPD). Sometimes, these complications can require emergency medical care and may be life threatening.  CAUSES Almost all URIs are caused by viruses. A virus is a type of germ and can spread from one person to another.  RISKS FACTORS You may be at risk for a URI if:   You smoke.   You have chronic heart or lung disease.  You have a weakened defense (immune) system.   You are very young or very old.   You have nasal allergies or asthma.  You work in crowded or poorly ventilated areas.  You work in health care facilities or schools. SIGNS AND SYMPTOMS  Symptoms typically develop 2-3 days after you come in contact with a cold virus. Most viral URIs last 7-10 days. However, viral URIs from the influenza virus (flu virus) can last 14-18 days and are typically more severe. Symptoms may include:   Runny or stuffy (congested) nose.   Sneezing.   Cough.   Sore throat.   Headache.   Fatigue.   Fever.   Loss of appetite.   Pain in your forehead, behind your eyes, and over your cheekbones (sinus pain).  Muscle aches.  DIAGNOSIS  Your  health care provider may diagnose a URI by:  Physical exam.  Tests to check that your symptoms are not due to another condition such as:  Strep throat.  Sinusitis.  Pneumonia.  Asthma. TREATMENT  A URI goes away on its own with time. It cannot be cured with medicines, but medicines may be prescribed or recommended to relieve symptoms. Medicines may help:  Reduce your fever.  Reduce your cough.  Relieve nasal congestion. HOME CARE INSTRUCTIONS   Take medicines only as directed by your health care provider.   Gargle warm saltwater or take cough drops to comfort your throat as directed by your health care provider.  Use a warm mist humidifier or inhale steam from a shower to increase air moisture. This may make it easier to breathe.  Drink enough fluid to keep your urine clear or pale yellow.   Eat soups and other clear broths and maintain good nutrition.   Rest as needed.   Return to work when your temperature has returned to normal or as your health care provider advises. You may need to stay home longer to avoid infecting others. You can also use a face mask and careful hand washing to prevent spread of the virus.  Increase the usage of your inhaler if you have asthma.   Do not use any tobacco products, including cigarettes, chewing tobacco, or electronic cigarettes. If you need help quitting, ask your health care provider. PREVENTION  The best way to protect yourself from getting a cold  is to practice good hygiene.   Avoid oral or hand contact with people with cold symptoms.   Wash your hands often if contact occurs.  There is no clear evidence that vitamin C, vitamin E, echinacea, or exercise reduces the chance of developing a cold. However, it is always recommended to get plenty of rest, exercise, and practice good nutrition.  SEEK MEDICAL CARE IF:   You are getting worse rather than better.   Your symptoms are not controlled by medicine.   You have  chills.  You have worsening shortness of breath.  You have brown or red mucus.  You have yellow or brown nasal discharge.  You have pain in your face, especially when you bend forward.  You have a fever.  You have swollen neck glands.  You have pain while swallowing.  You have white areas in the back of your throat. SEEK IMMEDIATE MEDICAL CARE IF:   You have severe or persistent:  Headache.  Ear pain.  Sinus pain.  Chest pain.  You have chronic lung disease and any of the following:  Wheezing.  Prolonged cough.  Coughing up blood.  A change in your usual mucus.  You have a stiff neck.  You have changes in your:  Vision.  Hearing.  Thinking.  Mood. MAKE SURE YOU:   Understand these instructions.  Will watch your condition.  Will get help right away if you are not doing well or get worse.   This information is not intended to replace advice given to you by your health care provider. Make sure you discuss any questions you have with your health care provider.   Document Released: 11/07/2000 Document Revised: 09/28/2014 Document Reviewed: 08/19/2013 Elsevier Interactive Patient Education 2016 [employer] Cough, Adult Coughing is a reflex that clears your throat and your airways. Coughing helps to heal and protect your lungs. It is normal to cough occasionally, but a cough that happens with other symptoms or lasts a long time may be a sign of a condition that needs treatment. A cough may last only 2-3 weeks (acute), or it may last longer than 8 weeks (chronic). CAUSES Coughing is commonly caused by:  Breathing in substances that irritate your lungs.  A viral or bacterial respiratory infection.  Allergies.  Asthma.  Postnasal drip.  Smoking.  Acid backing up from the stomach into the esophagus (gastroesophageal reflux).  Certain medicines.  Chronic lung problems, including COPD (or rarely, lung cancer).  Other medical conditions such  as heart failure. HOME CARE INSTRUCTIONS  Pay attention to any changes in your symptoms. Take these actions to help with your discomfort:  Take medicines only as told by your health care provider.  If you were prescribed an antibiotic medicine, take it as told by your health care provider. Do not stop taking the antibiotic even if you start to feel better.  Talk with your health care provider before you take a cough suppressant medicine.  Drink enough fluid to keep your urine clear or pale yellow.  If the air is dry, use a cold steam vaporizer or humidifier in your bedroom or your home to help loosen secretions.  Avoid anything that causes you to cough at work or at home.  If your cough is worse at night, try sleeping in a semi-upright position.  Avoid cigarette smoke. If you smoke, quit smoking. If you need help quitting, ask your health care provider.  Avoid caffeine.  Avoid alcohol.  Rest as needed. SEEK MEDICAL CARE  IF:   You have new symptoms.  You cough up pus.  Your cough does not get better after 2-3 weeks, or your cough gets worse.  You cannot control your cough with suppressant medicines and you are losing sleep.  You develop pain that is getting worse or pain that is not controlled with pain medicines.  You have a fever.  You have unexplained weight loss.  You have night sweats. SEEK IMMEDIATE MEDICAL CARE IF:  You cough up blood.  You have difficulty breathing.  Your heartbeat is very fast.   This information is not intended to replace advice given to you by your health care provider. Make sure you discuss any questions you have with your health care provider.   Document Released: 11/10/2010 Document Revised: 02/02/2015 Document Reviewed: 07/21/2014 Elsevier Interactive Patient Education [employer]

## 2015-08-03 ENCOUNTER — Encounter: Payer: Medicaid Other | Admitting: Family Medicine

## 2015-08-05 ENCOUNTER — Other Ambulatory Visit: Payer: Self-pay | Admitting: Neurology

## 2015-08-08 ENCOUNTER — Other Ambulatory Visit: Payer: Self-pay | Admitting: Neurology

## 2015-08-08 DIAGNOSIS — G35 Multiple sclerosis: Secondary | ICD-10-CM

## 2015-08-09 ENCOUNTER — Encounter (HOSPITAL_COMMUNITY): Payer: Self-pay

## 2015-08-09 ENCOUNTER — Encounter (HOSPITAL_COMMUNITY)
Admission: RE | Admit: 2015-08-09 | Discharge: 2015-08-09 | Disposition: A | Discharge status: Home / Self Care | Payer: Medicaid Other | Source: Ambulatory Visit | Attending: Neurology | Admitting: Neurology

## 2015-08-09 VITALS — BP 105/65 | HR 78 | Temp 98.4°F | Resp 16 | Ht 62.5 in | Wt 229.5 lb

## 2015-08-09 DIAGNOSIS — G35 Multiple sclerosis: Secondary | ICD-10-CM | POA: Diagnosis present

## 2015-08-09 MED ORDER — SODIUM CHLORIDE 0.9 % IV SOLN
INTRAVENOUS | Status: DC
  Administered 2015-08-09: 10:00:00 via INTRAVENOUS

## 2015-08-09 MED ORDER — LORATADINE 10 MG PO TABS: 10.0000 mg | ORAL_TABLET | ORAL | Status: DC

## 2015-08-09 MED ORDER — ACETAMINOPHEN 325 MG PO TABS
650.0000 mg | ORAL_TABLET | ORAL | Status: DC
  Administered 2015-08-09: 650 mg via ORAL
  Filled 2015-08-09: qty 2

## 2015-08-09 MED ORDER — SODIUM CHLORIDE 0.9 % IV SOLN
300.0000 mg | INTRAVENOUS | Status: DC
  Administered 2015-08-09: 300 mg via INTRAVENOUS
  Filled 2015-08-09: qty 15

## 2015-08-09 NOTE — Discharge Instructions (Signed)
TYSABRI °Natalizumab injection °What is this medicine? °NATALIZUMAB (na ta LIZ you mab) is used to treat relapsing multiple sclerosis. This drug is not a cure. It is also used to treat Crohn's disease. °This medicine may be used for other purposes; ask your health care provider or pharmacist if you have questions. °What should I tell my health care provider before I take this medicine? °They need to know if you have any of these conditions: °-immune system problems °-progressive multifocal leukoencephalopathy (PML) °-an unusual or allergic reaction to natalizumab, other medicines, foods, dyes, or preservatives °-pregnant or trying to get pregnant °-breast-feeding °How should I use this medicine? °This medicine is for infusion into a vein. It is given by a health care professional in a hospital or clinic setting. °A special MedGuide will be given to you by the pharmacist with each prescription and refill. Be sure to read this information carefully each time. °Talk to your pediatrician regarding the use of this medicine in children. This medicine is not approved for use in children. °Overdosage: If you think you have taken too much of this medicine contact a poison control center or emergency room at once. °NOTE: This medicine is only for you. Do not share this medicine with others. °What if I miss a dose? °It is important not to miss your dose. Call your doctor or health care professional if you are unable to keep an appointment. °What may interact with this medicine? °-azathioprine °-cyclosporine °-interferon °-6-mercaptopurine °-methotrexate °-steroid medicines like prednisone or cortisone °-TNF-alpha inhibitors like adalimumab, etanercept, and infliximab °-vaccines °This list may not describe all possible interactions. Give your health care provider a list of all the medicines, herbs, non-prescription drugs, or dietary supplements you use. Also tell them if you smoke, drink alcohol, or use illegal drugs. Some  items may interact with your medicine. °What should I watch for while using this medicine? °Your condition will be monitored carefully while you are receiving this medicine. Visit your doctor for regular check ups. Tell your doctor or healthcare professional if your symptoms do not start to get better or if they get worse. °Stay away from people who are sick. Call your doctor or health care professional for advice if you get a fever, chills or sore throat, or other symptoms of a cold or flu. Do not treat yourself. °In some patients, this medicine may cause a serious brain infection that may cause death. If you have any problems seeing, thinking, speaking, walking, or standing, tell your doctor right away. If you cannot reach your doctor, get urgent medical care. °What side effects may I notice from receiving this medicine? °Side effects that you should report to your doctor or health care professional as soon as possible: °-allergic reactions like skin rash, itching or hives, swelling of the face, lips, or tongue °-breathing problems °-changes in vision °-chest pain °-dark urine °-depression, feelings of sadness °-dizziness °-general ill feeling or flu-like symptoms °-irregular, missed, or painful menstrual periods °-light-colored stools °-loss of appetite, nausea °-muscle weakness °-problems with balance, talking, or walking °-right upper belly pain °-unusually weak or tired °-yellowing of the eyes or skin °Side effects that usually do not require medical attention (report to your doctor or health care professional if they continue or are bothersome): °-aches, pains °-headache °-stomach upset °-tiredness °This list may not describe all possible side effects. Call your doctor for medical advice about side effects. You may report side effects to FDA at 1-800-FDA-1088. °Where should I keep my medicine? °This   drug is given in a hospital or clinic and will not be stored at home. °NOTE: This sheet is a summary. It may  not cover all possible information. If you have questions about this medicine, talk to your doctor, pharmacist, or health care provider. °  °© 2016, Elsevier/Gold Standard. (2008-07-03 13:33:21) °Multiple Sclerosis °Multiple sclerosis (MS) is a disease of the central nervous system. It leads to the loss of the insulating covering of the nerves (myelin sheath) of your brain. When this happens, brain signals do not get sent properly or may not get sent at all. The age of onset of MS varies.  °CAUSES °The cause of MS is unknown. However, it is more common in the northern [place] than in the southern [place]. °RISK FACTORS °There is a higher number of women with MS than men. MS is not an illness that is passed down to you from your family members (inherited). However, your risk of MS is higher if you have a relative with MS. °SIGNS AND SYMPTOMS  °The symptoms of MS occur in episodes or attacks. These attacks may last weeks to months. There may be long periods of almost no symptoms between attacks. The symptoms of MS vary. This is because of the many different ways it affects the central nervous system. The main symptoms of MS include: °· Vision problems and eye pain. °· Numbness. °· Weakness. °· Inability to move your arms, hands, feet, or legs (paralysis). °· Balance problems. °· Tremors. °DIAGNOSIS  °Your health care provider can diagnose MS with the help of imaging exams and lab tests. These may include specialized X-ray exams and spinal fluid tests. The best imaging exam to confirm a diagnosis of MS is an MRI. °TREATMENT  °There is no known cure for MS, but there are medicines that can decrease the number and frequency of attacks. Steroids are often used for short-term relief. Physical and occupational therapy may also help. There are also many new alternative or complementary treatments available to help control the symptoms of MS. Ask your health care provider if any of these other options are right  for you. °HOME CARE INSTRUCTIONS  °· Take medicines as directed by your health care provider. °· Exercise as directed by your health care provider. °SEEK MEDICAL CARE IF: °You begin to feel depressed. °SEEK IMMEDIATE MEDICAL CARE IF: °· You develop paralysis. °· You have problems with bladder, bowel, or sexual function. °· You develop mental changes, such as forgetfulness or mood swings. °· You have a period of uncontrolled movements (seizure). °  °This information is not intended to replace advice given to you by your health care provider. Make sure you discuss any questions you have with your health care provider. °  °Document Released: 05/11/2000 Document Revised: 05/19/2013 Document Reviewed: 01/19/2013 °Elsevier Interactive Patient Education ©2016 Elsevier Inc. ° ° °

## 2015-08-09 NOTE — Telephone Encounter (Signed)
RX for lyrica faxed to [facility]. Received a receipt of confirmation.

## 2015-08-23 ENCOUNTER — Other Ambulatory Visit: Payer: Self-pay | Admitting: Neurology

## 2015-08-24 ENCOUNTER — Telehealth: Payer: Self-pay | Admitting: Neurology

## 2015-08-24 NOTE — Telephone Encounter (Signed)
I called patient. The patient still having ongoing headaches. She is due for another Botox injection on April 28, I will try to get a revisit before then to discuss other medical treatments for her headache, possibly going up on the Lyrica, may be adding baclofen. The patient also need some follow-up for the multiple sclerosis issue.

## 2015-08-24 NOTE — Telephone Encounter (Signed)
Pt called said the migraines are daily, 2 hours at a time for about 2 weeks. She said the LYRICA 50 MG capsule and indomethacin (INDOCIN SR) 75 MG CR capsule are not helping at all now. She has been taking ibuprofen 800mg  2 x day also. She wants to know if medication can be increased or changed to try new medication.

## 2015-08-25 ENCOUNTER — Ambulatory Visit (INDEPENDENT_AMBULATORY_CARE_PROVIDER_SITE_OTHER): Payer: Medicaid Other | Admitting: Neurology

## 2015-08-25 ENCOUNTER — Encounter: Payer: Self-pay | Admitting: Neurology

## 2015-08-25 VITALS — BP 99/68 | HR 83 | Ht 62.0 in | Wt 228.0 lb

## 2015-08-25 DIAGNOSIS — G43719 Chronic migraine without aura, intractable, without status migrainosus: Secondary | ICD-10-CM

## 2015-08-25 DIAGNOSIS — G35 Multiple sclerosis: Secondary | ICD-10-CM | POA: Diagnosis not present

## 2015-08-25 DIAGNOSIS — R52 Pain, unspecified: Secondary | ICD-10-CM | POA: Diagnosis not present

## 2015-08-25 DIAGNOSIS — M797 Fibromyalgia: Secondary | ICD-10-CM

## 2015-08-25 MED ORDER — PREGABALIN 100 MG PO CAPS: 100.0000 mg | ORAL_CAPSULE | Freq: Two times a day (BID) | ORAL | Status: DC

## 2015-08-25 MED ORDER — CITALOPRAM HYDROBROMIDE 40 MG PO TABS: 40.0000 mg | ORAL_TABLET | Freq: Every day | ORAL | Status: DC

## 2015-08-25 MED ORDER — QUETIAPINE FUMARATE 25 MG PO TABS
ORAL_TABLET | ORAL | Status: DC
Start: 2015-08-25 — End: 2015-12-29

## 2015-08-25 NOTE — Telephone Encounter (Signed)
I called and spoke to pt.  She received Dr. Clarisa Kindred message.  I made appt for her on 08-31-15 at 1200.  She asked about her medications. I will call [facility].

## 2015-08-25 NOTE — Progress Notes (Signed)
Reason for visit: Multiple sclerosis, headache  Frances Dudley is an 30 y.o. female  History of present illness:  Frances Dudley is a 30 year old right-handed black female with a history of chronic daily headache. The patient is on Lyrica, she is getting Botox injections with minimal benefit so far. She notes that ibuprofen does help some, she initially got benefit with indomethacin, but this no longer helps. The patient is having difficulty sleeping at night, she rests only about 4 hours at night. She takes some naps during the day. She reports total body pain that has gradually worsened over time, she has a history of fibromyalgia. The patient is on Tysabri for her multiple sclerosis, her last MRI of the brain was done in August 2016. She returns to the office today for an evaluation. The patient is getting Botox injections, the next treatment is due at the end of April. She believes that she got benefit for about one month after the last injection.  Past Medical History  Diagnosis Date  . Depression   . Multiple sclerosis (HCC)   . TB lung, latent   . Migraine   . Fibromyalgia 04/12/2014  . Chronic migraine without aura or status migrainosus 04/12/2014    Past Surgical History  Procedure Laterality Date  . None      Family History  Problem Relation Age of Onset  . Adopted: Yes  . Fibromyalgia Mother     Social history:  reports that she has never smoked. She has never used smokeless tobacco. She reports that she does not drink alcohol or use illicit drugs.    Allergies  Allergen Reactions  . Amitriptyline   . Baclofen     Headache, nausea  . Chlorhexidine Gluconate Itching     CHG wipe causes skin to be red and itchy - use alcohol when starting iv site   . Diazepam Nausea Only  . Sumatriptan Nausea And Vomiting and Other (See Comments)    lockjaw  . Zonegran [Zonisamide]     Intolerance     Medications:  Prior to Admission medications   Medication Sig  Start Date End Date Taking? Authorizing Provider  Botulinum Toxin Type A (BOTOX) 200 UNITS SOLR Inject 100 Units as directed once. 05/11/14  Yes York Spaniel, MD  Cholecalciferol (VITAMIN D) 2000 UNITS CAPS Take 6,000 Units by mouth daily.    Yes Historical Provider, MD  citalopram (CELEXA) 40 MG tablet Take 1 tablet (40 mg total) by mouth daily. 08/25/15  Yes York Spaniel, MD  fluticasone [facility] Xxx) 50 MCG/ACT nasal spray Place 2 sprays into both nostrils daily. X 14 days 06/22/14  Yes Ria Clock, PA  HYDROcodone-acetaminophen (NORCO) 10-325 MG tablet Take 1 tablet by mouth every 6 (six) hours as needed.   Yes Historical Provider, MD  ibuprofen (ADVIL,MOTRIN) 800 MG tablet Take 800 mg by mouth 2 (two) times daily.   Yes Historical Provider, MD  indomethacin (INDOCIN SR) 75 MG CR capsule Take 1 capsule (75 mg total) by mouth 2 (two) times daily with a meal. 04/11/15  Yes York Spaniel, MD  loratadine (CLARITIN) 10 MG tablet Take 1 tablet (10 mg total) by mouth daily. 04/06/14  Yes Massie Maroon, FNP  natalizumab (TYSABRI) 300 MG/15ML injection Inject into the vein. Every 28 days   Yes Historical Provider, MD  ondansetron (ZOFRAN) 4 MG tablet Take 1 tablet (4 mg total) by mouth every 6 (six) hours as needed for nausea or vomiting. 06/21/15  Yes Massie Maroon, FNP  pyridOXINE (VITAMIN B-6) 100 MG tablet Take 100 mg by mouth at bedtime.    Yes Historical Provider, MD  pregabalin (LYRICA) 100 MG capsule Take 1 capsule (100 mg total) by mouth 2 (two) times daily. 08/25/15   York Spaniel, MD  QUEtiapine (SEROQUEL) 25 MG tablet One tablet at night for one week, then take 2 tablets at night 08/25/15   York Spaniel, MD    ROS:  Out of a complete 14 system review of symptoms, the patient complains only of the following symptoms, and all other reviewed systems are negative.  Chills, fatigue, excessive sweating Hearing loss, ringing in the ears Eye pain, blurred  vision Palpitations of the heart Excessive thirst Constipation, nausea Insomnia, frequent waking, daytime sleepiness, snoring Environmental allergies Back pain, achy muscles, walking difficulty, neck pain Bruising easily Memory loss, headache, numbness, weakness Agitation, decreased concentration, depression, anxiety  Blood pressure 99/68, pulse 83, height  (1.575 m), weight 228 lb (103.42 kg), last menstrual period 07/04/2015.  Physical Exam  General: The patient is alert and cooperative at the time of the examination. The patient is markedly obese. Affect is flat.  Skin: No significant peripheral edema is noted.   Neurologic Exam  Mental status: The patient is alert and oriented x 3 at the time of the examination. The patient has apparent normal recent and remote memory, with an apparently normal attention span and concentration ability.   Cranial nerves: Facial symmetry is present. Speech is normal, no aphasia or dysarthria is noted. Extraocular movements are full. Visual fields are full. Pupils are equal, round, and reactive to light. Discs are flat bilaterally.  Motor: The patient has good strength in all 4 extremities.  Sensory examination: Soft touch sensation is symmetric on the face, arms, and legs.  Coordination: The patient has good finger-nose-finger and heel-to-shin bilaterally.  Gait and station: The patient has a normal gait. Tandem gait is normal. Romberg is negative. No drift is seen.  Reflexes: Deep tendon reflexes are symmetric.   Assessment/Plan:  1. Multiple sclerosis  2. Fibromyalgia  3. Chronic daily headache  4. Anxiety and depression  The patient has ongoing symptoms with chronic pain, daily headache. The patient so far has not dramatically responded to any medical therapy. The patient will be increased on the Lyrica taking 100 mg twice daily, she was placed on low-dose Seroquel at night taking 25 mg for a week and 50 mg at night. She will  follow-up for the Botox injection, follow-up for a revisit in about 5 months, we will check another MRI of the brain in the summer of 2017. A prescription was called in for her Celexa.  Marlan Palau MD 08/26/2015 7:22 AM  [facility] [address] [place], [place] [address]  Phone [phone] Fax [phone]

## 2015-08-25 NOTE — Telephone Encounter (Signed)
Patient called at 11:48am wanting to  Know if 12pm slot was still open for today, per nurse Andrey Campanile, patient can go ahead and come in now. Patient advised.

## 2015-08-31 ENCOUNTER — Ambulatory Visit: Payer: Medicaid Other | Admitting: Neurology

## 2015-09-01 ENCOUNTER — Telehealth: Payer: Self-pay | Admitting: *Deleted

## 2015-09-01 NOTE — Telephone Encounter (Signed)
I called [employer], [phone], spoke to [place].  Approval for Lyrica  po bid, [id number] good for one yr 08-26-2016.  Generic seroquel sent for approval.  Check on tomorrow after 0800 # [id number].

## 2015-09-02 NOTE — Telephone Encounter (Signed)
I called and got approval via [place] at [facility].on generic seroquel [id number].  I - [id number].  [facility] to [phone] that were approved and will call pt when ready.

## 2015-09-08 ENCOUNTER — Encounter (HOSPITAL_COMMUNITY): Admission: RE | Admit: 2015-09-08 | Payer: Medicaid Other | Source: Ambulatory Visit

## 2015-09-14 ENCOUNTER — Telehealth: Payer: Self-pay

## 2015-09-14 NOTE — Telephone Encounter (Signed)
Called [facility] to reschedule pt's Tysabri infusion from 09/08/15. [facility] will call back or call pt directly to reschedule.

## 2015-09-14 NOTE — Telephone Encounter (Signed)
Called pt to follow-up on rescheduling Tysabri infusion. She said that Frances Dudley had called and left a mssg and she will call them back to reschedule her appt.

## 2015-09-19 ENCOUNTER — Telehealth: Payer: Self-pay | Admitting: Neurology

## 2015-09-19 NOTE — Telephone Encounter (Signed)
Mearl Latin with [facility] is calling in regard to authorization status for the patient's Botox.  Please call back [phone].

## 2015-09-19 NOTE — Telephone Encounter (Signed)
Spoke with [place] and scheduled delivery.

## 2015-09-23 ENCOUNTER — Ambulatory Visit: Payer: Self-pay | Admitting: Neurology

## 2015-09-26 ENCOUNTER — Ambulatory Visit (INDEPENDENT_AMBULATORY_CARE_PROVIDER_SITE_OTHER): Payer: Medicaid Other | Admitting: Neurology

## 2015-09-26 ENCOUNTER — Encounter: Payer: Self-pay | Admitting: Neurology

## 2015-09-26 VITALS — BP 118/74 | HR 74 | Ht 62.0 in | Wt 232.5 lb

## 2015-09-26 DIAGNOSIS — R202 Paresthesia of skin: Secondary | ICD-10-CM

## 2015-09-26 DIAGNOSIS — G43719 Chronic migraine without aura, intractable, without status migrainosus: Secondary | ICD-10-CM

## 2015-09-26 NOTE — Progress Notes (Signed)
Frances Dudley is a 30 year old patient with history of multiple sclerosis and migraine headache. She comes in today for Botox injection, she has been noting some paresthesias of the hands and feet over the last several weeks, she also notes some pain in the feet with weightbearing, the pain goes away when she is off of her feet. The patient will be set up for nerve conduction studies on both arms, one leg, EMG of one leg. She has a history of fibromyalgia, she reports neck and low back pain.  Blood work will be done today for CBC, comprehensive metabolic profile, JC virus antibody. She will follow-up in 90 days for Botox.

## 2015-09-26 NOTE — Procedures (Signed)
     BOTOX PROCEDURE NOTE FOR MIGRAINE HEADACHE   HISTORY: Frances Dudley is a 30 year old patient with a history of intractable migraine headache. She has gained improvement with the Botox injection previously, just within the last 2 weeks her headaches become daily again. Her headaches are less severe, still occurring greater than 15 days of the month after the Botox injection on all but the headaches were daily prior to the injections.   Description of procedure:  The patient was placed in a sitting position. The standard protocol was used for Botox as follows, with 5 units of Botox injected at each site:   -Procerus muscle, midline injection  -Corrugator muscle, bilateral injection  -Frontalis muscle, bilateral injection, with 2 sites each side, medial injection was performed in the upper one third of the frontalis muscle, in the region vertical from the medial inferior edge of the superior orbital rim. The lateral injection was again in the upper one third of the forehead vertically above the lateral limbus of the cornea, 1.5 cm lateral to the medial injection site.  -Temporalis muscle injection, 4 sites, bilaterally. The first injection was 3 cm above the tragus of the ear, second injection site was 1.5 cm to 3 cm up from the first injection site in line with the tragus of the ear. The third injection site was 1.5-3 cm forward between the first 2 injection sites. The fourth injection site was 1.5 cm posterior to the second injection site.  -Occipitalis muscle injection, 3 sites, bilaterally. The first injection was done one half way between the occipital protuberance and the tip of the mastoid process behind the ear. The second injection site was done lateral and superior to the first, 1 fingerbreadth from the first injection. The third injection site was 1 fingerbreadth superiorly and medially from the first injection site.  -Cervical paraspinal muscle injection, 2 sites, bilateral,  the first injection site was 1 cm from the midline of the cervical spine, 3 cm inferior to the lower border of the occipital protuberance. The second injection site was 1.5 cm superiorly and laterally to the first injection site.  -Trapezius muscle injection was performed at 3 sites, bilaterally. The first injection site was in the upper trapezius muscle halfway between the inflection point of the neck, and the acromion. The second injection site was one half way between the acromion and the first injection site. The third injection was done between the first injection site and the inflection point of the neck.   A 200 unit bottle of Botox was used, 155 units were injected, the rest of the Botox was wasted. The patient tolerated the procedure well, there were no complications of the above procedure.  Botox NDC 1610-9604-54 Lot number [id number] Expiration date October 2019

## 2015-09-27 ENCOUNTER — Telehealth: Payer: Self-pay

## 2015-09-27 LAB — CBC WITH DIFFERENTIAL/PLATELET
BASOS ABS: 0 10*3/uL (ref 0.0–0.2)
Basos: 0 %
EOS (ABSOLUTE): 0.2 10*3/uL (ref 0.0–0.4)
Eos: 2 %
Hematocrit: 35.6 % (ref 34.0–46.6)
Hemoglobin: 11.4 g/dL (ref 11.1–15.9)
Immature Grans (Abs): 0 10*3/uL (ref 0.0–0.1)
Immature Granulocytes: 0 %
LYMPHS ABS: 3.2 10*3/uL — AB (ref 0.7–3.1)
Lymphs: 43 %
MCH: 28.8 pg (ref 26.6–33.0)
MCHC: 32 g/dL (ref 31.5–35.7)
MCV: 90 fL (ref 79–97)
MONOS ABS: 0.5 10*3/uL (ref 0.1–0.9)
Monocytes: 6 %
NEUTROS ABS: 3.6 10*3/uL (ref 1.4–7.0)
Neutrophils: 49 %
Platelets: 211 10*3/uL (ref 150–379)
RBC: 3.96 x10E6/uL (ref 3.77–5.28)
RDW: 16.8 % — AB (ref 12.3–15.4)
WBC: 7.4 10*3/uL (ref 3.4–10.8)

## 2015-09-27 LAB — COMPREHENSIVE METABOLIC PANEL
ALBUMIN: 3.9 g/dL (ref 3.5–5.5)
ALT: 22 IU/L (ref 0–32)
AST: 21 IU/L (ref 0–40)
Albumin/Globulin Ratio: 1.3 (ref 1.2–2.2)
Alkaline Phosphatase: 62 IU/L (ref 39–117)
BUN / CREAT RATIO: 11 (ref 9–23)
BUN: 8 mg/dL (ref 6–20)
Bilirubin Total: 0.3 mg/dL (ref 0.0–1.2)
CALCIUM: 9.1 mg/dL (ref 8.7–10.2)
CHLORIDE: 101 mmol/L (ref 96–106)
CO2: 25 mmol/L (ref 18–29)
Creatinine, Ser: 0.76 mg/dL (ref 0.57–1.00)
GFR calc non Af Amer: 108 mL/min/{1.73_m2} (ref >59)
GFR, EST AFRICAN AMERICAN: 124 mL/min/{1.73_m2} (ref >59)
GLUCOSE: 98 mg/dL (ref 65–99)
Globulin, Total: 3 g/dL (ref 1.5–4.5)
POTASSIUM: 4.3 mmol/L (ref 3.5–5.2)
SODIUM: 139 mmol/L (ref 134–144)
TOTAL PROTEIN: 6.9 g/dL (ref 6.0–8.5)

## 2015-09-27 NOTE — Telephone Encounter (Signed)
-----   Message from York Spaniel, MD sent at 09/27/2015  7:37 AM EDT -----  The blood work results are unremarkable. Please call the patient. JC virus antibody pending.  ----- Message -----    From: [facility] Lab Results In Interface    Sent: 09/27/2015   5:40 AM      To: York Spaniel, MD

## 2015-09-27 NOTE — Telephone Encounter (Signed)
Called pt w/ unremarkable lab results. Verbalized understanding and appreciation for call. 

## 2015-10-05 ENCOUNTER — Telehealth: Payer: Self-pay | Admitting: Neurology

## 2015-10-05 NOTE — Telephone Encounter (Signed)
The JC virus antibody panel was negative. 

## 2015-11-02 ENCOUNTER — Other Ambulatory Visit: Payer: Self-pay | Admitting: Neurology

## 2015-11-02 DIAGNOSIS — G35 Multiple sclerosis: Secondary | ICD-10-CM

## 2015-11-03 ENCOUNTER — Ambulatory Visit (INDEPENDENT_AMBULATORY_CARE_PROVIDER_SITE_OTHER): Payer: Medicaid Other | Admitting: Neurology

## 2015-11-03 ENCOUNTER — Encounter: Payer: Self-pay | Admitting: Neurology

## 2015-11-03 ENCOUNTER — Ambulatory Visit (INDEPENDENT_AMBULATORY_CARE_PROVIDER_SITE_OTHER): Payer: Self-pay | Admitting: Neurology

## 2015-11-03 DIAGNOSIS — M797 Fibromyalgia: Secondary | ICD-10-CM

## 2015-11-03 DIAGNOSIS — R202 Paresthesia of skin: Secondary | ICD-10-CM

## 2015-11-03 NOTE — Progress Notes (Signed)
Please refer to EMG and nerve conduction study procedure note. 

## 2015-11-03 NOTE — Procedures (Signed)
     HISTORY:  Frances Dudley is a 31 year old patient with history of multiple sclerosis and migraine headaches. The patient has reported a several week history of tingling and numbness of the hands and feet in a symmetric fashion. The patient is being evaluated for a possible neuropathy.  NERVE CONDUCTION STUDIES:  Nerve conduction studies were performed on both upper extremities. The distal motor latencies and motor amplitudes for the median and ulnar nerves were within normal limits. The F wave latencies and nerve conduction velocities for these nerves were also normal. The sensory latencies for the median and ulnar nerves were normal.  Nerve conduction studies were performed on the left lower extremity. The distal motor latencies and motor amplitudes for the peroneal and posterior tibial nerves were within normal limits. The nerve conduction velocities for these nerves were also normal. The H reflex latency was normal. The sensory latency for the peroneal nerve was within normal limits.   EMG STUDIES:  EMG study was performed on the left lower extremity:  The tibialis anterior muscle reveals 2 to 4K motor units with full recruitment. No fibrillations or positive waves were seen. The peroneus tertius muscle reveals 2 to 4K motor units with full recruitment. No fibrillations or positive waves were seen. The medial gastrocnemius muscle reveals 1 to 3K motor units with full recruitment. No fibrillations or positive waves were seen. The vastus lateralis muscle reveals 2 to 4K motor units with full recruitment. No fibrillations or positive waves were seen. The iliopsoas muscle reveals 2 to 4K motor units with full recruitment. No fibrillations or positive waves were seen.    IMPRESSION:  Nerve conduction studies done on both upper extremities and on the left lower extremity were within normal limits, no evidence of a peripheral neuropathy is seen. EMG evaluation of the left lower  extremity was unremarkable, without evidence of an overlying lumbosacral radiculopathy.  Marlan Palau MD 11/03/2015 4:47 PM  [facility] [address] [place], [place] [address]  Phone [phone] Fax [phone]

## 2015-11-09 NOTE — Addendum Note (Signed)
Addended by: Montel Clock E on: 11/09/2015 08:03 AM   Modules accepted: Orders

## 2015-11-13 ENCOUNTER — Other Ambulatory Visit: Payer: Self-pay | Admitting: Neurology

## 2015-11-13 DIAGNOSIS — G35 Multiple sclerosis: Secondary | ICD-10-CM

## 2015-11-14 ENCOUNTER — Encounter (HOSPITAL_COMMUNITY)
Admission: RE | Admit: 2015-11-14 | Discharge: 2015-11-14 | Disposition: A | Discharge status: Home / Self Care | Payer: Medicaid Other | Source: Ambulatory Visit | Attending: Neurology | Admitting: Neurology

## 2015-11-14 ENCOUNTER — Encounter (HOSPITAL_COMMUNITY): Payer: Self-pay

## 2015-11-14 VITALS — BP 104/60 | HR 94 | Temp 97.8°F | Resp 16 | Ht 62.5 in | Wt 242.6 lb

## 2015-11-14 DIAGNOSIS — G35 Multiple sclerosis: Secondary | ICD-10-CM | POA: Insufficient documentation

## 2015-11-14 MED ORDER — LORATADINE 10 MG PO TABS: 10.0000 mg | ORAL_TABLET | ORAL | Status: DC

## 2015-11-14 MED ORDER — ACETAMINOPHEN 325 MG PO TABS: 650.0000 mg | ORAL_TABLET | ORAL | Status: DC

## 2015-11-14 MED ORDER — SODIUM CHLORIDE 0.9 % IV SOLN
300.0000 mg | INTRAVENOUS | Status: DC
  Administered 2015-11-14: 300 mg via INTRAVENOUS
  Filled 2015-11-14: qty 15

## 2015-11-14 MED ORDER — SODIUM CHLORIDE 0.9 % IV SOLN
INTRAVENOUS | Status: DC
  Administered 2015-11-14: 11:00:00 via INTRAVENOUS

## 2015-11-14 NOTE — Discharge Instructions (Signed)
IF YOU ARE GOING TO BE 15 OR MINUTES LATE FOR YOUR APPOINTMENT, PLEASE CALL [phone] TO MAKE OTHER ARRANGEMENTS FOR YOUR TREATMENT  IF YOU ARRIVE EARLY FOR YOUR SCHEDULED APPOINTMENT , YOU MAY HAVE TO WAIT UNTIL YOUR SCHEDULED TIME.   Tysabri  Natalizumab injection What is this medicine? NATALIZUMAB (na ta LIZ you mab) is used to treat relapsing multiple sclerosis. This drug is not a cure. It is also used to treat Crohn's disease. This medicine may be used for other purposes; ask your health care provider or pharmacist if you have questions. What should I tell my health care provider before I take this medicine? They need to know if you have any of these conditions: -immune system problems -progressive multifocal leukoencephalopathy (PML) -an unusual or allergic reaction to natalizumab, other medicines, foods, dyes, or preservatives -pregnant or trying to get pregnant -breast-feeding How should I use this medicine? This medicine is for infusion into a vein. It is given by a health care professional in a hospital or clinic setting. A special MedGuide will be given to you by the pharmacist with each prescription and refill. Be sure to read this information carefully each time. Talk to your pediatrician regarding the use of this medicine in children. This medicine is not approved for use in children. Overdosage: If you think you have taken too much of this medicine contact a poison control center or emergency room at once. NOTE: This medicine is only for you. Do not share this medicine with others. What if I miss a dose? It is important not to miss your dose. Call your doctor or health care professional if you are unable to keep an appointment. What may interact with this medicine? -azathioprine -cyclosporine -interferon -6-mercaptopurine -methotrexate -steroid medicines like prednisone or cortisone -TNF-alpha inhibitors like adalimumab, etanercept, and infliximab -vaccines This list  may not describe all possible interactions. Give your health care provider a list of all the medicines, herbs, non-prescription drugs, or dietary supplements you use. Also tell them if you smoke, drink alcohol, or use illegal drugs. Some items may interact with your medicine. What should I watch for while using this medicine? Your condition will be monitored carefully while you are receiving this medicine. Visit your doctor for regular check ups. Tell your doctor or healthcare professional if your symptoms do not start to get better or if they get worse. Stay away from people who are sick. Call your doctor or health care professional for advice if you get a fever, chills or sore throat, or other symptoms of a cold or flu. Do not treat yourself. In some patients, this medicine may cause a serious brain infection that may cause death. If you have any problems seeing, thinking, speaking, walking, or standing, tell your doctor right away. If you cannot reach your doctor, get urgent medical care. What side effects may I notice from receiving this medicine? Side effects that you should report to your doctor or health care professional as soon as possible: -allergic reactions like skin rash, itching or hives, swelling of the face, lips, or tongue -breathing problems -changes in vision -chest pain -dark urine -depression, feelings of sadness -dizziness -general ill feeling or flu-like symptoms -irregular, missed, or painful menstrual periods -light-colored stools -loss of appetite, nausea -muscle weakness -problems with balance, talking, or walking -right upper belly pain -unusually weak or tired -yellowing of the eyes or skin Side effects that usually do not require medical attention (report to your doctor or health care professional if  they continue or are bothersome): -aches, pains -headache -stomach upset -tiredness This list may not describe all possible side effects. Call your doctor for  medical advice about side effects. You may report side effects to FDA at 1-800-FDA-1088. Where should I keep my medicine? This drug is given in a hospital or clinic and will not be stored at home. NOTE: This sheet is a summary. It may not cover all possible information. If you have questions about this medicine, talk to your doctor, pharmacist, or health care provider.    2016, Elsevier/Gold Standard. (2008-07-03 13:33:21)

## 2015-11-23 ENCOUNTER — Telehealth: Payer: Self-pay | Admitting: Neurology

## 2015-11-23 MED ORDER — PREGABALIN 100 MG PO CAPS: 100.0000 mg | ORAL_CAPSULE | Freq: Three times a day (TID) | ORAL | Status: DC

## 2015-11-23 MED ORDER — MODAFINIL 200 MG PO TABS: 200.0000 mg | ORAL_TABLET | Freq: Every day | ORAL | Status: DC

## 2015-11-23 NOTE — Telephone Encounter (Signed)
New scripts printed, signed, faxed to pharmacy.

## 2015-11-23 NOTE — Telephone Encounter (Signed)
I called patient. She has a myriad of issues. She has excessive daytime drowsiness, sleep study in the past has not shown obstructive sleep apnea. The patient sleeps 7 or 8 hours at night, and then has to take a nap every 2 hours during the day. She has a lot of fatigue, this could potentially be associated with some of her medications or the MS itself. She has ongoing pain, tingling in the legs and leg cramps. She has ongoing headaches, the Botox does not help much. She wants no Gilenya will help her pain and numbness, I indicated that I doubt that it would, would not recommend a switch for this reason. She remains on Tysabri.  Eye will increase the Lyrica taking 100 mg 3 times daily. I'll try to get her on Provigil if we get this covered taking 200 mg in the morning for the fatigue and excessive daytime drowsiness.

## 2015-11-23 NOTE — Telephone Encounter (Addendum)
Rn call patient about her constant pain and questions for gilenya. Pt was last seen in May 2017 for her MS and headaches and pain all over.Pt stated she is not resting at night. Pt is on lyrica and seroquel for sleep.Patient stated the seroquel helps but is awake during the day. Pt gets botox for headaches. Pt states she is having headache and leg cramps and tingling. Pt was just seen in May 2017 by Dr. Anne Hahn for a office visit. Pt is currently on Tysabri every 28 days for MS. Pt would like to discuss Gilenya and wants to know can the lyrica dosage be increase. Rn stated a message will be sent to Dr. Anne Hahn.

## 2015-11-23 NOTE — Telephone Encounter (Signed)
Patient is calling. She thinks she has restless legs because she is having cramps and tingling in her legs. She sleeps but she is always tired and cannot stay awake during the day. Her migraines have come back and are very severe ever since her last OV. Her feet are still hurting and she is in a lot of pain. Can the pregabalin (LYRICA) 100 MG capsule be increased? The patient would also like to discuss Gilenya.

## 2015-11-24 ENCOUNTER — Telehealth: Payer: Self-pay

## 2015-11-24 NOTE — Telephone Encounter (Signed)
Received a pa request for modafinil. I called [facility] and completed the pa for provigil. Brand name provigil was approved until 11/18/2016. PA# [id number].  I called [facility] in [place] and advised them of this information, spoke to [place] and [place].

## 2015-12-12 ENCOUNTER — Encounter (HOSPITAL_COMMUNITY)
Admission: RE | Admit: 2015-12-12 | Discharge: 2015-12-12 | Disposition: A | Discharge status: Home / Self Care | Payer: Medicaid Other | Source: Ambulatory Visit | Attending: Neurology | Admitting: Neurology

## 2015-12-12 ENCOUNTER — Encounter (HOSPITAL_COMMUNITY): Payer: Self-pay

## 2015-12-12 VITALS — BP 115/98 | HR 91 | Temp 97.8°F | Resp 16 | Ht 62.0 in | Wt 241.0 lb

## 2015-12-12 DIAGNOSIS — G35 Multiple sclerosis: Secondary | ICD-10-CM | POA: Diagnosis not present

## 2015-12-12 MED ORDER — SODIUM CHLORIDE 0.9 % IV SOLN
300.0000 mg | INTRAVENOUS | Status: DC
  Administered 2015-12-12: 300 mg via INTRAVENOUS
  Filled 2015-12-12: qty 15

## 2015-12-12 MED ORDER — ACETAMINOPHEN 325 MG PO TABS
650.0000 mg | ORAL_TABLET | ORAL | Status: DC
  Administered 2015-12-12: 650 mg via ORAL
  Filled 2015-12-12: qty 2

## 2015-12-12 MED ORDER — SODIUM CHLORIDE 0.9 % IV SOLN
INTRAVENOUS | Status: DC
  Administered 2015-12-12: 10:00:00 via INTRAVENOUS

## 2015-12-12 NOTE — Progress Notes (Signed)
Uneventful infusion. Pt stayed 30 minutes of the suggested post infusion time. No complaints at time of discharge.

## 2015-12-23 ENCOUNTER — Encounter: Payer: Self-pay | Admitting: Neurology

## 2015-12-23 ENCOUNTER — Ambulatory Visit (INDEPENDENT_AMBULATORY_CARE_PROVIDER_SITE_OTHER): Payer: Medicaid Other | Admitting: Neurology

## 2015-12-23 VITALS — BP 122/88 | HR 98 | Ht 62.0 in | Wt 243.0 lb

## 2015-12-23 DIAGNOSIS — G43719 Chronic migraine without aura, intractable, without status migrainosus: Secondary | ICD-10-CM | POA: Diagnosis not present

## 2015-12-23 DIAGNOSIS — H9193 Unspecified hearing loss, bilateral: Secondary | ICD-10-CM | POA: Diagnosis not present

## 2015-12-23 DIAGNOSIS — R413 Other amnesia: Secondary | ICD-10-CM

## 2015-12-23 NOTE — Procedures (Signed)
     BOTOX PROCEDURE NOTE FOR MIGRAINE HEADACHE   HISTORY: Frances Dudley is a 30 year old patient with a history of multiple sclerosis and a history of intractable migraine headache. The headaches continue the daily nature at this point. The patient indicates that following the Botox injection she gets excellent improvement for 2 and half months, then the headaches resume. She is able to function much better following the Botox injection. The Botox has consistently improve the frequency and severity of her headaches. The patient is having a lot of nausea, photophobia, phonophobia with her current headaches.   Description of procedure:  The patient was placed in a sitting position. The standard protocol was used for Botox as follows, with 5 units of Botox injected at each site:   -Procerus muscle, midline injection  -Corrugator muscle, bilateral injection  -Frontalis muscle, bilateral injection, with 2 sites each side, medial injection was performed in the upper one third of the frontalis muscle, in the region vertical from the medial inferior edge of the superior orbital rim. The lateral injection was again in the upper one third of the forehead vertically above the lateral limbus of the cornea, 1.5 cm lateral to the medial injection site.  -Temporalis muscle injection, 4 sites, bilaterally. The first injection was 3 cm above the tragus of the ear, second injection site was 1.5 cm to 3 cm up from the first injection site in line with the tragus of the ear. The third injection site was 1.5-3 cm forward between the first 2 injection sites. The fourth injection site was 1.5 cm posterior to the second injection site.  -Occipitalis muscle injection, 3 sites, bilaterally. The first injection was done one half way between the occipital protuberance and the tip of the mastoid process behind the ear. The second injection site was done lateral and superior to the first, 1 fingerbreadth from the first  injection. The third injection site was 1 fingerbreadth superiorly and medially from the first injection site.  -Cervical paraspinal muscle injection, 2 sites, bilateral, the first injection site was 1 cm from the midline of the cervical spine, 3 cm inferior to the lower border of the occipital protuberance. The second injection site was 1.5 cm superiorly and laterally to the first injection site.  -Trapezius muscle injection was performed at 3 sites, bilaterally. The first injection site was in the upper trapezius muscle halfway between the inflection point of the neck, and the acromion. The second injection site was one half way between the acromion and the first injection site. The third injection was done between the first injection site and the inflection point of the neck.   A 200 unit bottle of Botox was used, 155 units were injected, the rest of the Botox was wasted. The patient tolerated the procedure well, there were no complications of the above procedure.  Botox NDC 7628-3151-76 Lot number [id number] Expiration date February 2020

## 2015-12-23 NOTE — Progress Notes (Signed)
Frances Dudley comes in today for her Botox injection. She indicates that she has other issues. The patient has reported some intolerance to fish since she began her Provigil, I have told her that I have never heard of this side effect of the medication. She is to stop the Provigil or 5 days, and try eating fish again. The patient reports a two-month history of decreased hearing, I'll refer for ENT evaluation. The patient also reports some difficulty with memory, this may be a medication side effect or related to the multiple sclerosis. I will check blood work today.  Examination today reveals that the tympanic membranes are clear bilaterally.  The patient will follow-up on 02/03/2016.

## 2015-12-24 LAB — RPR: RPR: NONREACTIVE

## 2015-12-24 LAB — HIV ANTIBODY (ROUTINE TESTING W REFLEX): HIV SCREEN 4TH GENERATION: NONREACTIVE

## 2015-12-24 LAB — ANA W/REFLEX: Anti Nuclear Antibody(ANA): NEGATIVE

## 2015-12-24 LAB — VITAMIN B12: VITAMIN B 12: 310 pg/mL (ref 211–946)

## 2015-12-24 LAB — SEDIMENTATION RATE: Sed Rate: 11 mm/hr (ref 0–32)

## 2015-12-26 ENCOUNTER — Telehealth: Payer: Self-pay

## 2015-12-26 NOTE — Telephone Encounter (Signed)
Called pt w/ unremarkable lab results. Verbalized understanding and appreciation for call. 

## 2015-12-29 ENCOUNTER — Other Ambulatory Visit: Payer: Self-pay | Admitting: Neurology

## 2016-01-16 ENCOUNTER — Encounter (HOSPITAL_COMMUNITY): Admission: RE | Admit: 2016-01-16 | Payer: Medicaid Other | Source: Ambulatory Visit | Admitting: Neurology

## 2016-02-03 ENCOUNTER — Encounter: Payer: Self-pay | Admitting: Neurology

## 2016-02-03 ENCOUNTER — Ambulatory Visit (INDEPENDENT_AMBULATORY_CARE_PROVIDER_SITE_OTHER): Payer: Medicaid Other | Admitting: Neurology

## 2016-02-03 VITALS — BP 120/80 | HR 80 | Ht 62.0 in | Wt 246.0 lb

## 2016-02-03 DIAGNOSIS — G8929 Other chronic pain: Secondary | ICD-10-CM

## 2016-02-03 DIAGNOSIS — Z5181 Encounter for therapeutic drug level monitoring: Secondary | ICD-10-CM | POA: Diagnosis not present

## 2016-02-03 DIAGNOSIS — Z8669 Personal history of other diseases of the nervous system and sense organs: Secondary | ICD-10-CM

## 2016-02-03 DIAGNOSIS — M797 Fibromyalgia: Secondary | ICD-10-CM

## 2016-02-03 DIAGNOSIS — G35 Multiple sclerosis: Secondary | ICD-10-CM

## 2016-02-03 DIAGNOSIS — M549 Dorsalgia, unspecified: Secondary | ICD-10-CM | POA: Diagnosis not present

## 2016-02-03 MED ORDER — QUETIAPINE FUMARATE 100 MG PO TABS: 100.0000 mg | ORAL_TABLET | Freq: Every day | ORAL | 5 refills | Status: DC

## 2016-02-03 NOTE — Progress Notes (Signed)
Reason for visit: Headache, multiple sclerosis  Sebrina Cadorette is an 30 y.o. female  History of present illness:  Ms. Stoneburg is a 30 year old right-handed black female with a history of multiple sclerosis, chronic daily headache, and morbid obesity. The patient also has a history of fibromyalgia. The patient has gastroesophageal reflux disease, she has come off of Provigil as she felt that this was making her nauseated and vomit when she ate fish. Coming off of the medication however, the patient has had nausea and vomiting all the time at this point. The patient will eat, she does not choke with swallowing, but she may throw up later. The patient is not losing weight, she has actually gained 18 pounds since March 2017. The patient continues to have discomfort all over the body associated with fibromyalgia. The patient has chronic daily headaches, she gets some benefit with the Botox and she feels that it is worthwhile continue these treatments. The patient does not sleep well at night, she has difficulty getting to sleep and staying asleep. She has been taking Seroquel 50 mg at night. The patient does note some mild instability with balance, she denies any falls. She has not had any new numbness, weakness, vision changes, or difficulty controlling the bowels or the bladder. The patient returns to the office today for an evaluation. She remains on Tysabri for her multiple sclerosis. The patient reports symptoms of reflux with burning sensations in the chest.  Past Medical History:  Diagnosis Date  . Chronic migraine without aura or status migrainosus 04/12/2014  . Depression   . Fibromyalgia 04/12/2014  . Migraine   . Multiple sclerosis (HCC)   . TB lung, latent     Past Surgical History:  Procedure Laterality Date  . NONE      Family History  Problem Relation Age of Onset  . Adopted: Yes  . Fibromyalgia Mother     Social history:  reports that she has never smoked. She has  never used smokeless tobacco. She reports that she does not drink alcohol or use drugs.    Allergies  Allergen Reactions  . Amitriptyline   . Baclofen     Headache, nausea  . Chlorhexidine Gluconate Itching     CHG wipe causes skin to be red and itchy - use alcohol when starting iv site   . Diazepam Nausea Only  . Sumatriptan Nausea And Vomiting and Other (See Comments)    lockjaw  . Zonegran [Zonisamide]     Intolerance     Medications:  Prior to Admission medications   Medication Sig Start Date End Date Taking? Authorizing Provider  Botulinum Toxin Type A (BOTOX) 200 UNITS SOLR Inject 100 Units as directed once. 05/11/14  Yes York Spaniel, MD  Cholecalciferol (VITAMIN D) 2000 UNITS CAPS Take 6,000 Units by mouth daily.    Yes Historical Provider, MD  citalopram (CELEXA) 40 MG tablet Take 1 tablet (40 mg total) by mouth daily. 08/25/15  Yes York Spaniel, MD  fluticasone [facility]) 50 MCG/ACT nasal spray Place 2 sprays into both nostrils daily. X 14 days 06/22/14  Yes Ria Clock, PA  HYDROcodone-acetaminophen (NORCO) 10-325 MG tablet Take 1 tablet by mouth every 6 (six) hours as needed.   Yes Historical Provider, MD  ibuprofen (ADVIL,MOTRIN) 800 MG tablet Take 800 mg by mouth 2 (two) times daily.   Yes Historical Provider, MD  loratadine (CLARITIN) 10 MG tablet Take 1 tablet (10 mg total) by mouth daily. 04/06/14  Yes Massie MaroonLachina M Hollis, FNP  natalizumab (TYSABRI) 300 MG/15ML injection Inject into the vein. Every 28 days   Yes Historical Provider, MD  ondansetron (ZOFRAN) 4 MG tablet Take 1 tablet (4 mg total) by mouth every 6 (six) hours as needed for nausea or vomiting. 06/21/15  Yes Massie MaroonLachina M Hollis, FNP  pregabalin (LYRICA) 100 MG capsule Take 1 capsule (100 mg total) by mouth 3 (three) times daily. 11/23/15  Yes York Spanielharles K Willis, MD  pyridOXINE (VITAMIN B-6) 100 MG tablet Take 100 mg by mouth at bedtime.    Yes Historical Provider, MD  QUEtiapine (SEROQUEL) 25 MG  tablet Take 2 tablets (50 mg total) by mouth at bedtime. 12/29/15  Yes York Spanielharles K Willis, MD  modafinil (PROVIGIL) 200 MG tablet Take 1 tablet (200 mg total) by mouth daily. Patient not taking: Reported on 02/03/2016 11/23/15   York Spanielharles K Willis, MD  oxyCODONE-acetaminophen (PERCOCET) 10-325 MG tablet Take 1 tablet by mouth every 8 (eight) hours. 01/11/16   Historical Provider, MD    ROS:  Out of a complete 14 system review of symptoms, the patient complains only of the following symptoms, and all other reviewed systems are negative.  Decreased activity, fatigue Hearing loss, ringing in the ears Light sensitivity, loss of vision Chest pain, palpitations of the heart Excessive thirst Diarrhea, nausea, vomiting Restless legs, insomnia, frequent waking, daytime sleepiness Environmental allergies, food allergies Joint pain, back pain, achy muscles, muscle cramps, walking difficulty, neck pain Memory loss, dizziness, headache, numbness, weakness, tremors Depression, anxiety, agitation  Blood pressure 120/80, pulse 80, height 5\' 2"  (1.575 m), weight 246 lb (111.6 kg).  Physical Exam  General: The patient is alert and cooperative at the time of the examination. The patient is morbidly obese.  Skin: No significant peripheral edema is noted.   Neurologic Exam  Mental status: The patient is alert and oriented x 3 at the time of the examination. The patient has apparent normal recent and remote memory, with an apparently normal attention span and concentration ability.   Cranial nerves: Facial symmetry is present. Speech is normal, no aphasia or dysarthria is noted. Extraocular movements are full. Visual fields are full. Pupils are equal, round, and reactive to light. Discs are flat bilaterally.  Motor: The patient has good strength in all 4 extremities.  Sensory examination: Soft touch sensation is symmetric on the face, arms, and legs.  Coordination: The patient has good finger-nose-finger  and heel-to-shin bilaterally.  Gait and station: The patient has a normal gait. Tandem gait is normal. Romberg is negative. No drift is seen.  Reflexes: Deep tendon reflexes are symmetric.   Assessment/Plan:  1. Morbid obesity  2. Multiple sclerosis  3. Chronic daily headache  4. Fibromyalgia  5. Reported frequent nausea and vomiting  The patient will go on Prilosec to see if this improves her stomach symptoms. If not, we may do an esophagram will refer to a gastroenterologist. The patient will increase the Seroquel taking 100 mg at night. The patient will continue the dysarthria, she will have blood work done today. MRI of the brain will be done with and without gadolinium enhancement. She will follow-up in 6 months.  Marlan Palau. Keith Willis MD 02/03/2016 9:22 AM  [facility] [address] [place], [place] [id number]  Phone [phone] Fax [phone]

## 2016-02-03 NOTE — Patient Instructions (Signed)
   We will check MRI of the brain. Start Prilosec OTC for the stomach. Call if the vomiting continues.

## 2016-02-04 LAB — CBC WITH DIFFERENTIAL/PLATELET
BASOS ABS: 0 10*3/uL (ref 0.0–0.2)
Basos: 0 %
EOS (ABSOLUTE): 0.3 10*3/uL (ref 0.0–0.4)
EOS: 4 %
HEMATOCRIT: 33.9 % — AB (ref 34.0–46.6)
HEMOGLOBIN: 11.3 g/dL (ref 11.1–15.9)
IMMATURE GRANS (ABS): 0 10*3/uL (ref 0.0–0.1)
Immature Granulocytes: 0 %
LYMPHS ABS: 3.7 10*3/uL — AB (ref 0.7–3.1)
LYMPHS: 52 %
MCH: 29.4 pg (ref 26.6–33.0)
MCHC: 33.3 g/dL (ref 31.5–35.7)
MCV: 88 fL (ref 79–97)
MONOCYTES: 8 %
Monocytes Absolute: 0.6 10*3/uL (ref 0.1–0.9)
NEUTROS ABS: 2.7 10*3/uL (ref 1.4–7.0)
Neutrophils: 36 %
Platelets: 217 10*3/uL (ref 150–379)
RBC: 3.85 x10E6/uL (ref 3.77–5.28)
RDW: 16.5 % — ABNORMAL HIGH (ref 12.3–15.4)
WBC: 7.3 10*3/uL (ref 3.4–10.8)

## 2016-02-04 LAB — COMPREHENSIVE METABOLIC PANEL
ALT: 16 IU/L (ref 0–32)
AST: 17 IU/L (ref 0–40)
Albumin/Globulin Ratio: 1.5 (ref 1.2–2.2)
Albumin: 3.9 g/dL (ref 3.5–5.5)
Alkaline Phosphatase: 61 IU/L (ref 39–117)
BUN / CREAT RATIO: 14 (ref 9–23)
BUN: 10 mg/dL (ref 6–20)
Bilirubin Total: <0.2 mg/dL (ref 0.0–1.2)
CALCIUM: 9.5 mg/dL (ref 8.7–10.2)
CHLORIDE: 102 mmol/L (ref 96–106)
CO2: 26 mmol/L (ref 18–29)
CREATININE: 0.73 mg/dL (ref 0.57–1.00)
GFR calc non Af Amer: 112 mL/min/{1.73_m2} (ref >59)
GFR, EST AFRICAN AMERICAN: 130 mL/min/{1.73_m2} (ref >59)
GLUCOSE: 88 mg/dL (ref 65–99)
Globulin, Total: 2.6 g/dL (ref 1.5–4.5)
Potassium: 4.4 mmol/L (ref 3.5–5.2)
Sodium: 142 mmol/L (ref 134–144)
Total Protein: 6.5 g/dL (ref 6.0–8.5)

## 2016-02-07 ENCOUNTER — Other Ambulatory Visit: Payer: Self-pay | Admitting: Neurology

## 2016-02-07 ENCOUNTER — Telehealth: Payer: Self-pay

## 2016-02-07 DIAGNOSIS — G35 Multiple sclerosis: Secondary | ICD-10-CM

## 2016-02-07 NOTE — Telephone Encounter (Signed)
-----   Message from York Spanielharles K Willis, MD sent at 02/04/2016  8:46 AM EDT -----  The blood work results are unremarkable. JC viral antibodies are pending. Please call the patient. ----- Message ----- From: Nell RangeInterface, [facility] Lab Results In Sent: 02/04/2016   5:40 AM To: York Spanielharles K Willis, MD

## 2016-02-07 NOTE — Telephone Encounter (Signed)
Called pt w/ unremarkable lab results and pending JCV. Verbalized understanding and appreciation for call.

## 2016-02-13 ENCOUNTER — Encounter (HOSPITAL_COMMUNITY)
Admission: RE | Admit: 2016-02-13 | Discharge: 2016-02-13 | Disposition: A | Discharge status: Home / Self Care | Payer: Medicaid Other | Source: Ambulatory Visit | Attending: Neurology | Admitting: Neurology

## 2016-02-13 ENCOUNTER — Encounter (HOSPITAL_COMMUNITY): Payer: Self-pay

## 2016-02-13 DIAGNOSIS — G35 Multiple sclerosis: Secondary | ICD-10-CM | POA: Diagnosis not present

## 2016-02-13 MED ORDER — ACETAMINOPHEN 325 MG PO TABS
650.0000 mg | ORAL_TABLET | ORAL | Status: DC
  Administered 2016-02-13: 650 mg via ORAL
  Filled 2016-02-13: qty 2

## 2016-02-13 MED ORDER — SODIUM CHLORIDE 0.9 % IV SOLN
INTRAVENOUS | Status: DC
  Administered 2016-02-13: 10:00:00 via INTRAVENOUS

## 2016-02-13 MED ORDER — LORATADINE 10 MG PO TABS
10.0000 mg | ORAL_TABLET | ORAL | Status: DC
  Administered 2016-02-13: 10 mg via ORAL
  Filled 2016-02-13: qty 1

## 2016-02-13 MED ORDER — SODIUM CHLORIDE 0.9 % IV SOLN
300.0000 mg | INTRAVENOUS | Status: DC
  Administered 2016-02-13: 300 mg via INTRAVENOUS
  Filled 2016-02-13: qty 15

## 2016-02-13 NOTE — Progress Notes (Signed)
Pt states she is having low back pain "8/10"  After an epidural back injection this past Friday.  She states that the pain was a 5 or 6/10 prior to her injection and has remained an 8 since then.  No redness at site or temp noted.  Call into Dr Clarisa KindredWillis's office and notified Dr Melvyn Novasarmen Dohmeier of previous. Dr Dohmeier gives permission to proceed with Tysabri infusion.

## 2016-02-14 ENCOUNTER — Telehealth: Payer: Self-pay | Admitting: Neurology

## 2016-02-14 NOTE — Telephone Encounter (Signed)
JC virus antibody level is negative. 

## 2016-02-15 ENCOUNTER — Emergency Department
Admission: EM | Admit: 2016-02-15 | Discharge: 2016-02-16 | Disposition: A | Discharge status: Home / Self Care | Payer: Medicaid Other | Attending: Emergency Medicine | Admitting: Emergency Medicine

## 2016-02-15 ENCOUNTER — Ambulatory Visit
Admission: RE | Admit: 2016-02-15 | Discharge: 2016-02-15 | Disposition: A | Discharge status: Home / Self Care | Payer: Medicaid Other | Source: Ambulatory Visit | Attending: Neurology | Admitting: Neurology

## 2016-02-15 ENCOUNTER — Telehealth: Payer: Self-pay | Admitting: Neurology

## 2016-02-15 ENCOUNTER — Encounter: Payer: Self-pay | Admitting: *Deleted

## 2016-02-15 DIAGNOSIS — M545 Low back pain: Secondary | ICD-10-CM | POA: Insufficient documentation

## 2016-02-15 DIAGNOSIS — G35 Multiple sclerosis: Secondary | ICD-10-CM

## 2016-02-15 DIAGNOSIS — Z79899 Other long term (current) drug therapy: Secondary | ICD-10-CM | POA: Diagnosis not present

## 2016-02-15 LAB — URINALYSIS COMPLETE WITH MICROSCOPIC (ARMC ONLY)
Bilirubin Urine: NEGATIVE
GLUCOSE, UA: NEGATIVE mg/dL
Hgb urine dipstick: NEGATIVE
KETONES UR: NEGATIVE mg/dL
Leukocytes, UA: NEGATIVE
Nitrite: NEGATIVE
PROTEIN: 30 mg/dL — AB
Specific Gravity, Urine: 1.026 (ref 1.005–1.030)
pH: 6 (ref 5.0–8.0)

## 2016-02-15 LAB — POCT PREGNANCY, URINE: PREG TEST UR: NEGATIVE

## 2016-02-15 MED ORDER — GADOBENATE DIMEGLUMINE 529 MG/ML IV SOLN
20.0000 mL | Freq: Once | INTRAVENOUS | Status: AC | PRN
  Administered 2016-02-15: 20 mL via INTRAVENOUS

## 2016-02-15 NOTE — Telephone Encounter (Signed)
I called patient. The patient had an epidural steroid injection and has had pain on the right greater than left side of the back that has worsened over the last several days, she has called multiple times to the [facility] without any call back. The patient denies any significant discomfort down the legs, but it does hurt to walk. The patient may require CT or MRI evaluation of the low back exclude a hemorrhagic complication of the procedure.  The patient is unable to reach her treating physician, she may have to go through the emergency room for an evaluation.

## 2016-02-15 NOTE — ED Triage Notes (Signed)
Pt c/o low back pain since Friday after epidural steroid injection. Pt states pain management clinic has not returned her calls regarding this new pain. Pt's neurologist advised her to come to ED for evaluation. Pt sees pain management for chronic pain related to MS. Pt ambulatory to triage w steady gait and no observable distress at this time. Pt denies relief from medication and position change. Pt did not drive self to ED today.

## 2016-02-15 NOTE — Telephone Encounter (Signed)
Pt called said she had epidural on Friday referred by [facility] . She sts she has been in pain since and has left 3 msg but the calls are not being returned. She wants D W to advise if having pain is normal. I advised her she should call Pain Management again. She said she wants Dr Lacretia Nicks Rn to call

## 2016-02-15 NOTE — ED Provider Notes (Signed)
[facility] Emergency Department Provider Note    ____________________________________________   I have reviewed the triage vital signs and the nursing notes.   HISTORY  Chief Complaint Back Pain   History limited by: Not Limited   HPI Frances Dudley is a 30 y.o. female with history of MS, chronic back pain, who presents to the emergency department today because of concern for worsening and new back pain since an epidural steroid injection 5 days ago. The patient states that she started having bad pain shortly after the procedure. This is her third injection and she has not had this pain before. The pain is worse in the right lower back. The patient also has some pain in the left lower back. She denies any central pain. Pain is worse with bending over and movement. She denies any change in defecation or urination. No fevers. No new numbness in legs.   Past Medical History:  Diagnosis Date  . Chronic migraine without aura or status migrainosus 04/12/2014  . Depression   . Fibromyalgia 04/12/2014  . Migraine   . Multiple sclerosis (HCC)   . TB lung, latent     Patient Active Problem List   Diagnosis Date Noted  . Irregular menstrual cycle 04/28/2015  . Right ovarian cyst 04/28/2015  . Breast pain 04/27/2015  . Chronic back pain 04/27/2015  . Intractable chronic migraine without aura 09/07/2014  . Pendulous breast 07/30/2014  . Obesity, morbid (HCC) 07/29/2014  . Depression 05/03/2014  . Skin lesion of face 05/03/2014  . BMI 38.0-38.9,adult 05/03/2014  . Fibromyalgia 04/12/2014  . Chronic migraine without aura or status migrainosus 04/12/2014  . Environmental allergies 04/06/2014  . Multiple sclerosis (HCC) 04/06/2014  . Faintness 04/06/2014  . Vertigo 04/06/2014  . Dizziness 04/06/2014  . Nausea without vomiting 04/06/2014  . History of migraine headaches 04/06/2014  . Generalized pain 04/06/2014  . TB lung, latent 04/06/2014    Past  Surgical History:  Procedure Laterality Date  . NONE      Prior to Admission medications   Medication Sig Start Date End Date Taking? Authorizing Provider  Botulinum Toxin Type A (BOTOX) 200 UNITS SOLR Inject 100 Units as directed once. 05/11/14   York Spaniel, MD  Cholecalciferol (VITAMIN D) 2000 UNITS CAPS Take 6,000 Units by mouth daily.     Historical Provider, MD  citalopram (CELEXA) 40 MG tablet Take 1 tablet (40 mg total) by mouth daily. 08/25/15   York Spaniel, MD  fluticasone (FLONASE) 50 MCG/ACT nasal spray Place 2 sprays into both nostrils daily. X 14 days 06/22/14   Ria Clock, PA  HYDROcodone-acetaminophen (NORCO) 10-325 MG tablet Take 1 tablet by mouth every 6 (six) hours as needed.    Historical Provider, MD  ibuprofen (ADVIL,MOTRIN) 800 MG tablet Take 800 mg by mouth 2 (two) times daily.    Historical Provider, MD  loratadine (CLARITIN) 10 MG tablet Take 1 tablet (10 mg total) by mouth daily. 04/06/14   Massie Maroon, FNP  modafinil (PROVIGIL) 200 MG tablet Take 1 tablet (200 mg total) by mouth daily. Patient not taking: Reported on 02/03/2016 11/23/15   York Spaniel, MD  natalizumab (TYSABRI) 300 MG/15ML injection Inject into the vein. Every 28 days    Historical Provider, MD  ondansetron (ZOFRAN) 4 MG tablet Take 1 tablet (4 mg total) by mouth every 6 (six) hours as needed for nausea or vomiting. 06/21/15   Massie Maroon, FNP  oxyCODONE-acetaminophen (PERCOCET) 10-325 MG  tablet Take 1 tablet by mouth every 8 (eight) hours. 01/11/16   Historical Provider, MD  pregabalin (LYRICA) 100 MG capsule Take 1 capsule (100 mg total) by mouth 3 (three) times daily. 11/23/15   York Spanielharles K Willis, MD  pyridOXINE (VITAMIN B-6) 100 MG tablet Take 100 mg by mouth at bedtime.     Historical Provider, MD  QUEtiapine (SEROQUEL) 100 MG tablet Take 1 tablet (100 mg total) by mouth at bedtime. 02/03/16   York Spanielharles K Willis, MD    Allergies Amitriptyline; Baclofen; Chlorhexidine  gluconate; Diazepam; Sumatriptan; and Zonegran [zonisamide]  Family History  Problem Relation Age of Onset  . Adopted: Yes  . Fibromyalgia Mother     Social History Social History  Substance Use Topics  . Smoking status: Never Smoker  . Smokeless tobacco: Never Used  . Alcohol use No     Comment: occ    Review of Systems  Constitutional: Negative for fever. Cardiovascular: Negative for chest pain. Respiratory: Negative for shortness of breath. Gastrointestinal: Negative for abdominal pain, vomiting and diarrhea. Genitourinary: Negative for dysuria. Musculoskeletal: Positive for lower back pain. Skin: Negative for rash. Neurological: Negative for headaches, focal weakness or numbness.  10-point ROS otherwise negative.  ____________________________________________   PHYSICAL EXAM:  VITAL SIGNS: ED Triage Vitals  Enc Vitals Group     BP 02/15/16 2210 116/79     Pulse Rate 02/15/16 2210 84     Resp 02/15/16 2210 18     Temp 02/15/16 2210 98.1 F (36.7 C)     Temp Source 02/15/16 2210 Oral     SpO2 02/15/16 2210 100 %     Weight 02/15/16 2209 246 lb (111.6 kg)     Height 02/15/16 2209 5\' 2"  (1.575 m)     Head Circumference --      Peak Flow --      Pain Score 02/15/16 2218 8   Constitutional: Alert and oriented. Well appearing and in no distress. Eyes: Conjunctivae are normal. Normal extraocular movements. ENT   Head: Normocephalic and atraumatic.   Nose: No congestion/rhinnorhea.   Mouth/Throat: Mucous membranes are moist.   Neck: No stridor. Hematological/Lymphatic/Immunilogical: No cervical lymphadenopathy. Cardiovascular: Normal rate, regular rhythm.  No murmurs, rubs, or gallops. Respiratory: Normal respiratory effort without tachypnea nor retractions. Breath sounds are clear and equal bilaterally. No wheezes/rales/rhonchi. Gastrointestinal: Soft and nontender. No distention.  Genitourinary: Deferred Musculoskeletal: Mild tenderness to  palpation of the patients lower back. Worse on the right side. No spinal tenderness. No erythema or warmth. Neurologic:  Normal speech and language. No gross focal neurologic deficits are appreciated.  Skin:  Skin is warm, dry and intact. No rash noted. Psychiatric: Mood and affect are normal. Speech and behavior are normal. Patient exhibits appropriate insight and judgment.  ____________________________________________    LABS (pertinent positives/negatives)  Labs Reviewed  URINALYSIS COMPLETEWITH MICROSCOPIC ([facility] ONLY) - Abnormal; Notable for the following:       Result Value   Color, Urine YELLOW (*)    APPearance HAZY (*)    Protein, ur 30 (*)    Bacteria, UA RARE (*)    Squamous Epithelial / LPF 6-30 (*)    All other components within normal limits  POC URINE PREG, ED  POCT PREGNANCY, URINE     ____________________________________________   EKG  None  ____________________________________________    RADIOLOGY  CT lumbar spine   IMPRESSION:  1. No evidence of fracture or subluxation along the lumbar spine.  2. Prior site of epidural steroid  injection is perhaps minimally  characterized, but no significant focal soft tissue abnormalities  are seen. No evidence of abscess.  3. Question of cholelithiasis, not well characterized due to motion  artifact. Gallbladder otherwise unremarkable.  4. 3.4 cm left adnexal cystic focus is likely physiologic, given the  patient's age.  5. Benign-appearing sclerosis at the sacroiliac joints.        ____________________________________________   PROCEDURES  Procedures  ____________________________________________   INITIAL IMPRESSION / ASSESSMENT AND PLAN / ED COURSE  Pertinent labs & imaging results that were available during my care of the patient were reviewed by me and considered in my medical decision making (see chart for details).  Patient presented to the emergency department tonight because of concern  for lower back pain after recent epidural steroid injection. No concerning signs or symptoms for spinal cord lesion. CT scan did not show any obvious abscess or other concerning abnormality. Discussed return precautions. ____________________________________________   FINAL CLINICAL IMPRESSION(S) / ED DIAGNOSES  Final diagnoses:  Bilateral low back pain, with sciatica presence unspecified     Note: This dictation was prepared with Dragon dictation. Any transcriptional errors that result from this process are unintentional    Phineas Semen, MD 02/16/16 [phone]

## 2016-02-15 NOTE — Telephone Encounter (Signed)
Spoke to pt. Reports that she had an ESI on Friday at the pain clinic. Since that time, she has had severe lower back pain w/ cramping around her mid section, difficulty w/ bending and walking. Denies redness or swelling. Has used cold/heat packs, ibuprofen and prescribed pain medication w/o relief. Says this is her 3rd epidural but she has never experienced pain afterwards.

## 2016-02-16 ENCOUNTER — Encounter: Payer: Self-pay | Admitting: Emergency Medicine

## 2016-02-16 ENCOUNTER — Emergency Department: Payer: Medicaid Other

## 2016-02-16 NOTE — ED Notes (Signed)
Pt taken to CT.

## 2016-02-16 NOTE — Discharge Instructions (Signed)
As we discussed please return for any difficulty with urination or defecation, new numbness or weakness in your legs or any other new or concerning symptoms.

## 2016-02-17 ENCOUNTER — Telehealth: Payer: Self-pay | Admitting: Neurology

## 2016-02-17 DIAGNOSIS — G4489 Other headache syndrome: Secondary | ICD-10-CM

## 2016-02-17 NOTE — Telephone Encounter (Signed)
  I called patient. The patient has a minimally abnormal MRI the brain. There is some evidence that she may have pseudotumor cerebri, she does have chronic daily headaches, some reduction in hearing. She is amenable to getting lumbar puncture evaluation. I will get this set up.   MRI brain 02/16/16:  IMPRESSION:  Mildly abnormal MRI brain (with and without) demonstrating: 1. Few small foci of periventricular, subcortical, juxtacortical T2 hyperintensities. No abnormal lesions are seen on post contrast views. Findings are nonspecific but can be compatible patient's diagnosis of multiple sclerosis. 2. Enlarged partially empty sella and enlarged optic nerve sheaths associated with tortuous bilateral optic nerves. Findings are nonspecific but can be seen in association with idiopathic intracranial hypertension. 3. Compared to MRI on 12/27/14, no significant change.

## 2016-02-24 ENCOUNTER — Ambulatory Visit (INDEPENDENT_AMBULATORY_CARE_PROVIDER_SITE_OTHER): Payer: Medicaid Other

## 2016-02-24 DIAGNOSIS — Z23 Encounter for immunization: Secondary | ICD-10-CM | POA: Diagnosis not present

## 2016-02-28 ENCOUNTER — Telehealth: Payer: Self-pay

## 2016-02-28 ENCOUNTER — Other Ambulatory Visit: Payer: Self-pay | Admitting: Neurology

## 2016-02-28 DIAGNOSIS — G35 Multiple sclerosis: Secondary | ICD-10-CM

## 2016-02-28 NOTE — Telephone Encounter (Signed)
Tysabri physician order form completed, awaiting MD signature.

## 2016-02-28 NOTE — Telephone Encounter (Signed)
-----   Message from Katherene Ponto, [place] sent at 02/28/2016  9:55 AM EDT ----- Regarding: Request for Tysabri Orders Contact: [phone] Hello all. This message is being sent to request Tysabri orders for Frances Dudley's next infusion. Thank you in advance.  [employer]

## 2016-02-28 NOTE — Telephone Encounter (Signed)
Orders signed and faxed along w/ demographic/ insurance info and current med list.

## 2016-02-29 ENCOUNTER — Other Ambulatory Visit: Payer: Medicaid Other

## 2016-02-29 ENCOUNTER — Ambulatory Visit
Admission: RE | Admit: 2016-02-29 | Discharge: 2016-02-29 | Disposition: A | Discharge status: Home / Self Care | Payer: Medicaid Other | Source: Ambulatory Visit | Attending: Neurology | Admitting: Neurology

## 2016-02-29 ENCOUNTER — Other Ambulatory Visit: Payer: Self-pay | Admitting: Neurology

## 2016-02-29 ENCOUNTER — Telehealth: Payer: Self-pay | Admitting: Neurology

## 2016-02-29 DIAGNOSIS — G4489 Other headache syndrome: Secondary | ICD-10-CM

## 2016-02-29 LAB — CSF CELL COUNT WITH DIFFERENTIAL
RBC Count, CSF: 53 cells/uL — ABNORMAL HIGH (ref 0–10)
WBC, CSF: 2 cells/uL (ref 0–5)

## 2016-02-29 LAB — GLUCOSE, CSF: GLUCOSE CSF: 59 mg/dL (ref 43–76)

## 2016-02-29 LAB — PROTEIN, CSF: TOTAL PROTEIN, CSF: 23 mg/dL (ref 15–45)

## 2016-02-29 MED ORDER — ACETAZOLAMIDE 125 MG PO TABS: ORAL_TABLET | ORAL | 3 refills | Status: DC

## 2016-02-29 NOTE — Discharge Instructions (Signed)

## 2016-02-29 NOTE — Telephone Encounter (Signed)
I called the patient. The OP of the LP was 330 mm water, the patient has pseudotumor cerebri. She will be started on Diamox.

## 2016-02-29 NOTE — Progress Notes (Signed)
Discharge instructions explained again to pt.

## 2016-02-29 NOTE — Progress Notes (Signed)
1 SST tube  drawn from left AC. Pt tolerated procedure well, site is unremarkable and discharge instructions explained to pt.

## 2016-03-02 ENCOUNTER — Telehealth: Payer: Self-pay

## 2016-03-02 NOTE — Telephone Encounter (Signed)
Spoke with patient after she had an LP here 02/29/16.  She states she has had a constant headache, not necessarily positional since the procedure where her opening pressure was 33cm H2O with a closing pressure of 18.  I encouraged her to contact Dr. Anne Hahn' office if the headache was or became unbearable, though reiterated how he may want her to try to get used to this new "normal" pressure, however abnormal it felt to her head.  jkl

## 2016-03-06 LAB — OLIGOCLONAL BANDS, CSF + SERM

## 2016-03-12 ENCOUNTER — Other Ambulatory Visit (HOSPITAL_COMMUNITY): Payer: Self-pay | Admitting: Neurology

## 2016-03-12 ENCOUNTER — Encounter (HOSPITAL_COMMUNITY): Admission: RE | Admit: 2016-03-12 | Payer: Medicaid Other | Source: Ambulatory Visit | Admitting: Neurology

## 2016-03-23 ENCOUNTER — Ambulatory Visit (INDEPENDENT_AMBULATORY_CARE_PROVIDER_SITE_OTHER): Payer: Medicaid Other | Admitting: Neurology

## 2016-03-23 ENCOUNTER — Encounter: Payer: Self-pay | Admitting: Neurology

## 2016-03-23 VITALS — BP 104/72 | Ht 62.0 in | Wt 244.5 lb

## 2016-03-23 DIAGNOSIS — G43719 Chronic migraine without aura, intractable, without status migrainosus: Secondary | ICD-10-CM | POA: Diagnosis not present

## 2016-03-23 DIAGNOSIS — G932 Benign intracranial hypertension: Secondary | ICD-10-CM

## 2016-03-23 HISTORY — DX: Benign intracranial hypertension: G93.2

## 2016-03-23 MED ORDER — ACETAZOLAMIDE 125 MG PO TABS: ORAL_TABLET | ORAL | 3 refills | Status: DC

## 2016-03-23 MED ORDER — PREGABALIN 100 MG PO CAPS: 100.0000 mg | ORAL_CAPSULE | Freq: Two times a day (BID) | ORAL | 5 refills | Status: DC

## 2016-03-23 NOTE — Procedures (Signed)
     BOTOX PROCEDURE NOTE FOR MIGRAINE HEADACHE    HISTORY: Frances Dudley is a 30 year old patient with history of multiple sclerosis and a history of intractable migraine headache. The patient has gotten excellent benefit with Botox, she will have a significant reduction in headache frequency and severity. Within 2 weeks of the next Botox injection, the headaches come back on again and are daily. After the Botox injection, the headaches may occur once or twice a week with reduced severity, but she to control. The patient returns for a Botox treatment.   Description of procedure:  The patient was placed in a sitting position. The standard protocol was used for Botox as follows, with 5 units of Botox injected at each site:   -Procerus muscle, midline injection  -Corrugator muscle, bilateral injection  -Frontalis muscle, bilateral injection, with 2 sites each side, medial injection was performed in the upper one third of the frontalis muscle, in the region vertical from the medial inferior edge of the superior orbital rim. The lateral injection was again in the upper one third of the forehead vertically above the lateral limbus of the cornea, 1.5 cm lateral to the medial injection site.  -Temporalis muscle injection, 4 sites, bilaterally. The first injection was 3 cm above the tragus of the ear, second injection site was 1.5 cm to 3 cm up from the first injection site in line with the tragus of the ear. The third injection site was 1.5-3 cm forward between the first 2 injection sites. The fourth injection site was 1.5 cm posterior to the second injection site.  -Occipitalis muscle injection, 3 sites, bilaterally. The first injection was done one half way between the occipital protuberance and the tip of the mastoid process behind the ear. The second injection site was done lateral and superior to the first, 1 fingerbreadth from the first injection. The third injection site was 1  fingerbreadth superiorly and medially from the first injection site.  -Cervical paraspinal muscle injection, 2 sites, bilateral, the first injection site was 1 cm from the midline of the cervical spine, 3 cm inferior to the lower border of the occipital protuberance. The second injection site was 1.5 cm superiorly and laterally to the first injection site.  -Trapezius muscle injection was performed at 3 sites, bilaterally. The first injection site was in the upper trapezius muscle halfway between the inflection point of the neck, and the acromion. The second injection site was one half way between the acromion and the first injection site. The third injection was done between the first injection site and the inflection point of the neck.   A 200 unit bottle of Botox was used, 155 units were injected, the rest of the Botox was wasted. The patient tolerated the procedure well, there were no complications of the above procedure.  Botox NDC 7902-4097-35 Lot number [id number] Expiration date May 2020

## 2016-03-23 NOTE — Progress Notes (Signed)
The patient comes in for Botox injection today. She has had a lumbar puncture recently that shows an elevated opening pressure of 33 centimeters of water. The patient has been placed on Diamox, but she has developed stomach upset, reflux symptoms, confusion on the medication. She has had gait instability.  The Diamox will be reduced to 125 mg twice daily dose. If this does not alleviate her symptoms, the medication will be discontinued.  The Lyrica will be reduced to 100 mg twice daily.  The patient does report excessive daytime drowsiness, there was some question on the sleep study that she may have sleep apnea, not sure that this was ever addressed.

## 2016-03-27 ENCOUNTER — Telehealth: Payer: Self-pay | Admitting: Neurology

## 2016-03-27 ENCOUNTER — Encounter: Payer: Self-pay | Admitting: Neurology

## 2016-03-27 NOTE — Telephone Encounter (Signed)
I called the patient. She has seen Dr. Vickey Huger in the past, the prior sleep study did not have enough sleep to make a conclusion whether or not she would require CPAP. Dr. Vickey Huger indicates that she would like to see her back in revisit, the patient is amenable to this. I will get this set up.

## 2016-03-28 ENCOUNTER — Telehealth: Payer: Self-pay | Admitting: Neurology

## 2016-03-28 MED ORDER — POTASSIUM CHLORIDE ER 10 MEQ PO TBCR: 20.0000 meq | EXTENDED_RELEASE_TABLET | Freq: Every day | ORAL | 3 refills | Status: DC

## 2016-03-28 MED ORDER — FUROSEMIDE 20 MG PO TABS: 20.0000 mg | ORAL_TABLET | Freq: Every day | ORAL | 3 refills | Status: DC

## 2016-03-28 NOTE — Telephone Encounter (Signed)
Pt called to advise she suppose to be tapering off the diamox but she is not tolerating the medication. She has been experiencing weakness, needs help to walk, can't get words out. She is wanting to stop it and try another medication.

## 2016-03-28 NOTE — Telephone Encounter (Signed)
I called pt. I offered her an appt with Dr. Vickey Huger on 11/27 at 9:00am. Pt accepted this appt. Pt verbalized understanding of appt date and time.

## 2016-03-28 NOTE — Telephone Encounter (Signed)
I called patient. She is not able to tolerate the Diamox secondary to weakness, gait instability, and stomach upset. She will stop the medication, I will start Lasix and potassium supplementation for the pseudotumor cerebri.

## 2016-03-29 NOTE — Telephone Encounter (Signed)
Returned pt TC. Reports that she took last dose of Diamox almost a week ago but continues to have poor appetite w/ N/V/D when she does eat/drink. She picked up her new scripts for Lasix and potassium but has not yet started new meds. Says that to her knowledge, she has not had any sick contacts. As of right now, Dr. Anne HahnWillis' next available work-in appt is in 2 wks.

## 2016-03-29 NOTE — Telephone Encounter (Signed)
Pt called in stating she is still having trouble eating and drinking. States, "No matter what I do I still have diarrhea or vomiting. Is this due to the medication or is this something else? " Pt's pcp can not see her until Dec. Please call and advise [phone]

## 2016-03-29 NOTE — Telephone Encounter (Signed)
I called the patient. She claims that she has been off of the Diamox for a week, she still is nauseated every time she eats and she will throw up. She reports stomach cramps and abdominal discomfort. I have indicated that she is to stop the Mobic. I have asked her to get an evaluation through her primary physician first to exclude an abdominal process, gastritis or gallbladder disease or pancreatitis.

## 2016-04-09 ENCOUNTER — Encounter (HOSPITAL_COMMUNITY): Admission: RE | Admit: 2016-04-09 | Payer: Medicaid Other | Source: Ambulatory Visit | Admitting: Neurology

## 2016-04-23 ENCOUNTER — Encounter: Payer: Self-pay | Admitting: Neurology

## 2016-04-23 ENCOUNTER — Ambulatory Visit (INDEPENDENT_AMBULATORY_CARE_PROVIDER_SITE_OTHER): Payer: Medicaid Other | Admitting: Neurology

## 2016-04-23 VITALS — BP 108/76 | HR 86 | Resp 20 | Ht 62.0 in | Wt 243.0 lb

## 2016-04-23 DIAGNOSIS — F5104 Psychophysiologic insomnia: Secondary | ICD-10-CM | POA: Diagnosis not present

## 2016-04-23 DIAGNOSIS — G4721 Circadian rhythm sleep disorder, delayed sleep phase type: Secondary | ICD-10-CM | POA: Diagnosis not present

## 2016-04-23 DIAGNOSIS — R0683 Snoring: Secondary | ICD-10-CM | POA: Diagnosis not present

## 2016-04-23 DIAGNOSIS — G4711 Idiopathic hypersomnia with long sleep time: Secondary | ICD-10-CM | POA: Insufficient documentation

## 2016-04-23 DIAGNOSIS — E669 Obesity, unspecified: Secondary | ICD-10-CM

## 2016-04-23 MED ORDER — AMPHETAMINE-DEXTROAMPHETAMINE 10 MG PO TABS: 10.0000 mg | ORAL_TABLET | Freq: Every day | ORAL | 0 refills | Status: DC

## 2016-04-23 NOTE — Patient Instructions (Signed)

## 2016-04-23 NOTE — Addendum Note (Signed)
Addended by: Melvyn Novas on: 04/23/2016 09:56 AM   Modules accepted: Orders

## 2016-04-23 NOTE — Progress Notes (Signed)
SLEEP MED[facility]INE CL[facility]IC   Provider:  Melvyn Novasarmen  Jaice Digioia, M D  Referring Provider: Massie MaroonHollis, Lachina M, FNP Primary Care Physician:  Massie MaroonHollis,Lachina M, FNP  Chief Complaint  Patient presents with  . Follow-up    pt says she is sleeping at the wrong times    HPI:  Frances Dudley is a 30 y.o. female , seen here as a referral/ revisit  from Dr. Anne HahnWillis , who last has seen this MS patient on 03-22-2016 , When the patient reported that she was still excessively daytime sleepy.  Frances Dudley was seen in a sleep study on 05/03/2015, upon referral of Marthann SchillerMichelle Matthews her primary internist and Dr. Lesia SagoKeith Willis, her primary neurologist. The patient was described at the time is a 30 year old African-American right-handed female with a history of multiple sclerosis, tuberculosis, depression, frequent migraines and fibromyalgia. She had presented for persistent headaches, witnessed snoring and crescendo snoring was considered morbidly obese at the time her BMI was 42 her Epworth sleepiness score was endorsed at 16 out of 24 points, fatigue degree at 62 points and the pH Q9 depression inventory at 14 points. The patient did not show to have any apnea, only during REM sleep was an exacerbation to a REM AHI of 17.6 over all AHI overnight was only 2.1. There were no significant periodic limb movements, there was no prolonged desaturation of oxygen. The study revealed some snoring but not obstructive sleep apnea neither hypoxemia and positive airway pressure therapy was not indicated.  The high degree of fatigue and sleepiness was not found to be related to an organic sleep disorder, but can be attributed to autoimmune diseases, which in general a very fatiguing.  Sleep habits are as follows: No established sleep routines, " I don't sleep at night " and end up "sleeping in the daytime". The patient goes to bed at around 8:30 but she cannot fall asleep or rarely can fall asleep at that time. She reports that  between 2 and 3 AM in the morning as the most likely time for her to actually fall asleep .She struggles with insomnia. She usually wakes up or is able to get her daughter ready for school at about 6:30 AM but then goes and returns to bed sleeping in daytime. Often until 2 PM.   Social history:  The patient is adopted. She is single , has one daughter, lives with mother, daughter and grandparents. Her daughter usually goes to bed around 8:00 and soon after Frances Dudley will follow.  Review of Systems: Out of a complete 14 system review, the patient complains of only the following symptoms, and all other reviewed systems are negative.  Epworth score 15/24 , Fatigue severity score 61  , depression score 16.    Social History   Social History  . Marital status: Single    Spouse name: N/A  . Number of children: 1  . Years of education: some college   Occupational History  . unemployed    Social History Main Topics  . Smoking status: Never Smoker  . Smokeless tobacco: Never Used  . Alcohol use No     Comment: occ  . Drug use: No  . Sexual activity: Yes    Birth control/ protection: None   Other Topics Concern  . Not on file   Social History Narrative   Patient is adopted.   Right-handed   Caffeine: 2 per day    Family History  Problem Relation Age of Onset  . Adopted: Yes  .  Fibromyalgia Mother     Past Medical History:  Diagnosis Date  . Chronic migraine without aura or status migrainosus 04/12/2014  . Depression   . Fall   . Fibromyalgia 04/12/2014  . Migraine   . Multiple sclerosis (HCC)   . Pseudotumor cerebri 03/23/2016  . TB lung, latent     Past Surgical History:  Procedure Laterality Date  . NONE      Current Outpatient Prescriptions  Medication Sig Dispense Refill  . Botulinum Toxin Type A (BOTOX) 200 UNITS SOLR Inject 100 Units as directed once. 2 each 0  . Cholecalciferol (VITAMIN D) 2000 UNITS CAPS Take 6,000 Units by mouth daily.     .  citalopram (CELEXA) 40 MG tablet Take 1 tablet (40 mg total) by mouth daily. 90 tablet 3  . fluticasone (FLONASE) 50 MCG/ACT nasal spray Place 2 sprays into both nostrils daily. X 14 days 16 g 0  . furosemide (LASIX) 20 MG tablet Take 1 tablet (20 mg total) by mouth daily. 30 tablet 3  . ibuprofen (ADVIL,MOTRIN) 800 MG tablet Take 800 mg by mouth 2 (two) times daily.    Marland Kitchen loratadine (CLARITIN) 10 MG tablet Take 1 tablet (10 mg total) by mouth daily. 30 tablet 2  . natalizumab (TYSABRI) 300 MG/15ML injection Inject into the vein. Every 28 days    . ondansetron (ZOFRAN) 4 MG tablet Take 1 tablet (4 mg total) by mouth every 6 (six) hours as needed for nausea or vomiting. 20 tablet 0  . oxyCODONE-acetaminophen (PERCOCET) 10-325 MG tablet Take 1 tablet by mouth every 8 (eight) hours.  0  . potassium chloride (K-DUR) 10 MEQ tablet Take 2 tablets (20 mEq total) by mouth daily. 60 tablet 3  . pregabalin (LYRICA) 100 MG capsule Take 1 capsule (100 mg total) by mouth 2 (two) times daily. 90 capsule 5  . pyridOXINE (VITAMIN B-6) 100 MG tablet Take 100 mg by mouth at bedtime.     Marland Kitchen QUEtiapine (SEROQUEL) 100 MG tablet Take 1 tablet (100 mg total) by mouth at bedtime. 30 tablet 5  . modafinil (PROVIGIL) 200 MG tablet Take 1 tablet (200 mg total) by mouth daily. (Patient not taking: Reported on 04/23/2016) 30 tablet 3   Current Facility-Administered Medications  Medication Dose Route Frequency Provider Last Rate Last Dose  . botulinum toxin Type A (BOTOX) injection 200 Units  200 Units Intramuscular Once York Spaniel, MD        Allergies as of 04/23/2016 - Review Complete 04/23/2016  Allergen Reaction Noted  . Amitriptyline  04/06/2014  . Baclofen  02/02/2015  . Chlorhexidine gluconate Itching 02/22/2015  . Diamox [acetazolamide]  03/28/2016  . Diazepam Nausea Only 03/22/2015  . Sumatriptan Nausea And Vomiting and Other (See Comments) 02/15/2014  . Zonegran [zonisamide]  05/03/2014    Vitals: BP  108/76   Pulse 86   Resp 20   Ht 5\' 2"  (1.575 m)   Wt 243 lb (110.2 kg)   BMI 44.45 kg/m  Last Weight:  Wt Readings from Last 1 Encounters:  04/23/16 243 lb (110.2 kg)   ZOX:WRUE mass index is 44.45 kg/m.     Last Height:   Ht Readings from Last 1 Encounters:  04/23/16 5\' 2"  (1.575 m)    Physical exam:  General: The patient is awake, alert and appears not in acute distress. The patient is well groomed. She is morbidly obese.  Head: Normocephalic, atraumatic. Neck is supple. Mallampati 4,  neck circumference: 15. Nasal airflow patent ,  TMJ click is evident . Retrognathia is seen.  Cardiovascular:  Regular rate and rhythm , without  murmurs or carotid bruit, and without distended neck veins. Respiratory: Lungs are clear to auscultation. Skin:  Without evidence of edema, or rash Trunk: Neurologic exam :The patient is awake and alert, oriented to place and time.     Attention span & concentration ability appears normal.  Speech is fluent,  without  dysarthria, dysphonia or aphasia.  Mood and affect are appropriate.  Cranial nerves: Pupils are equal and briskly reactive to light.   Extraocular movements  in vertical and horizontal planes intact and without nystagmus. Visual fields by finger perimetry are intact. Hearing to finger rub intact.  Facial sensation intact to fine touch. Facial motor strength is symmetric and tongue and uvula move midline. Shoulder shrug was symmetrical.   Frances Dudley has been unable to establish a nighttime routine for several years now. I would strongly recommend a sleep deprivation approach. Usually, sleep psychologist will work with patients that have developed a delayed circadian rhythm. Step #1 as to deny oneself sleep in daytime with the results that it is easier to establish a sleep pattern at night. This rarely takes more than 48 hours. I would advise Frances Dudley to avoid daytime sleep, and to continue go to bed around 9 or 10 PM, taking a  over-the-counter melatonin preparation to help establish a new sleep cycle. The recommended dose of 6 mg or less for an adult. I would also like for her that when she rises in the morning she avoids going back to bed, and I would not be opposed for her to have a coffee in the morning to allow her to stay more alert. She should also expose herself to natural daylight which will help her brain signal I think that is daytime. On the contrary she should eliminate all screen light while in bed as this signal lysis to her body that his morning time. It interferes with her sleep pattern. If the patient's interested I can recommend some psychologist here in town that worked was [employer] and have addressed sleep hygiene issues, medication and other steps that are nonmedical and nonmedication related to help patient establish better sleep.   The patient was advised of the nature of the diagnosed sleep disorder , the treatment options and risks for general a health and wellness arising from not treating the condition.  I spent more than 30 minutes of face to face time with the patient. Greater than 50% of time was spent in counseling and coordination of care. We have discussed the diagnosis and differential and I answered the patient's questions.     Assessment:  After physical and neurologic examination, review of laboratory studies,  Personal review of imaging studies, reports of other /same  Imaging studies ,  Results of polysomnography/ neurophysiology testing and pre-existing records as far as provided in visit., my assessment is   1) Referral to sleep psychology, [facility] counseling.   2) melatonin at intended bedtime , 3, 5 or 6 mg .   3) avoid daytime sleep, no naps over 30 minutes. No screen light, TV or smart phone in bed.    The patient's Migraines are not related to apnea or hypoxemia, but can be triggered by sleep deprivation.     Plan:  Treatment plan and additional workup  :  Return to Dr. Anne Hahn.  Patient suspected to have ADD, but was never treated. We can use daytime Adderall to help  with drowsiness. The patient is interested, she can  try 10 mg Adderall in AM, not extended release.  Will ask Dr. Anne Hahn to follow her other neurological needs. Frances Mylar Elana Jian MD  04/23/2016   CC: Massie Maroon, Fnp [phone] [place], [place] [address]

## 2016-05-03 ENCOUNTER — Telehealth: Payer: Self-pay

## 2016-05-03 DIAGNOSIS — G4719 Other hypersomnia: Secondary | ICD-10-CM

## 2016-05-03 MED ORDER — AMPHETAMINE-DEXTROAMPHETAMINE 10 MG PO TABS: 10.0000 mg | ORAL_TABLET | Freq: Two times a day (BID) | ORAL | 0 refills | Status: DC

## 2016-05-03 NOTE — Addendum Note (Signed)
Addended by: Melvyn Novas on: 05/03/2016 04:43 PM   Modules accepted: Orders

## 2016-05-03 NOTE — Telephone Encounter (Signed)
I spoke to patient and she is aware of results below. She is aware that I have put Rx at front desk and our office hours.

## 2016-05-03 NOTE — Telephone Encounter (Signed)
Pt says she's tried generic Adderall. She takes it in the morning but by about noon each day, she just "crashes and can't keep her eyes open." Please call back to advise.

## 2016-05-03 NOTE — Telephone Encounter (Signed)
Probably needs a second dose at lunch. I will write for 60 tab, 10 mg adderall. CD

## 2016-05-07 ENCOUNTER — Encounter (HOSPITAL_COMMUNITY): Payer: Self-pay

## 2016-05-07 ENCOUNTER — Encounter (HOSPITAL_COMMUNITY)
Admission: RE | Admit: 2016-05-07 | Discharge: 2016-05-07 | Disposition: A | Discharge status: Home / Self Care | Payer: Medicaid Other | Source: Ambulatory Visit | Attending: Neurology | Admitting: Neurology

## 2016-05-07 DIAGNOSIS — G35 Multiple sclerosis: Secondary | ICD-10-CM | POA: Diagnosis present

## 2016-05-07 MED ORDER — SODIUM CHLORIDE 0.9 % IV SOLN
INTRAVENOUS | Status: DC
  Administered 2016-05-07: 09:00:00 via INTRAVENOUS

## 2016-05-07 MED ORDER — SODIUM CHLORIDE 0.9 % IV SOLN
300.0000 mg | INTRAVENOUS | Status: DC
Start: 2016-05-07 — End: 2016-05-08
  Administered 2016-05-07: 300 mg via INTRAVENOUS
  Filled 2016-05-07: qty 15

## 2016-05-07 MED ORDER — ACETAMINOPHEN 325 MG PO TABS
650.0000 mg | ORAL_TABLET | ORAL | Status: DC
  Administered 2016-05-07: 650 mg via ORAL
  Filled 2016-05-07: qty 2

## 2016-05-07 MED ORDER — LORATADINE 10 MG PO TABS: 10.0000 mg | ORAL_TABLET | ORAL | Status: DC

## 2016-05-07 NOTE — Progress Notes (Signed)
Uneventful Tysabri infusion.  Pt stayed the one hour post infusion observation.  Pt dc ambulatory to lobby.  Next appointments for January and February given to pt.

## 2016-05-09 ENCOUNTER — Encounter: Payer: Self-pay | Admitting: Family Medicine

## 2016-05-09 ENCOUNTER — Ambulatory Visit (INDEPENDENT_AMBULATORY_CARE_PROVIDER_SITE_OTHER): Payer: Medicaid Other | Admitting: Family Medicine

## 2016-05-09 VITALS — BP 108/60 | HR 88 | Temp 98.1°F | Resp 16 | Ht 62.0 in | Wt 243.0 lb

## 2016-05-09 DIAGNOSIS — K219 Gastro-esophageal reflux disease without esophagitis: Secondary | ICD-10-CM | POA: Diagnosis not present

## 2016-05-09 DIAGNOSIS — M545 Low back pain, unspecified: Secondary | ICD-10-CM

## 2016-05-09 DIAGNOSIS — R0989 Other specified symptoms and signs involving the circulatory and respiratory systems: Secondary | ICD-10-CM | POA: Diagnosis not present

## 2016-05-09 DIAGNOSIS — E669 Obesity, unspecified: Secondary | ICD-10-CM

## 2016-05-09 DIAGNOSIS — R059 Cough, unspecified: Secondary | ICD-10-CM

## 2016-05-09 DIAGNOSIS — R05 Cough: Secondary | ICD-10-CM

## 2016-05-09 DIAGNOSIS — G8929 Other chronic pain: Secondary | ICD-10-CM

## 2016-05-09 LAB — COMPLETE METABOLIC PANEL WITH GFR
ALT: 20 U/L (ref 6–29)
AST: 24 U/L (ref 10–30)
Albumin: 3.8 g/dL (ref 3.6–5.1)
Alkaline Phosphatase: 47 U/L (ref 33–115)
BUN: 9 mg/dL (ref 7–25)
CO2: 27 mmol/L (ref 20–31)
Calcium: 9.2 mg/dL (ref 8.6–10.2)
Chloride: 104 mmol/L (ref 98–110)
Creat: 0.85 mg/dL (ref 0.50–1.10)
GFR, Est African American: >89 mL/min (ref >=60)
GFR, Est Non African American: >89 mL/min (ref >=60)
Glucose, Bld: 83 mg/dL (ref 65–99)
Potassium: 3.6 mmol/L (ref 3.5–5.3)
Sodium: 140 mmol/L (ref 135–146)
Total Bilirubin: 0.2 mg/dL (ref 0.2–1.2)
Total Protein: 6.6 g/dL (ref 6.1–8.1)

## 2016-05-09 LAB — POCT GLYCOSYLATED HEMOGLOBIN (HGB A1C): Hemoglobin A1C: 5.3

## 2016-05-09 LAB — TSH: TSH: 0.83 m[IU]/L

## 2016-05-09 MED ORDER — CHLORPHEN-PE-ACETAMINOPHEN 4-10-325 MG PO TABS: 1.0000 | ORAL_TABLET | Freq: Four times a day (QID) | ORAL | 0 refills | Status: DC | PRN

## 2016-05-09 MED ORDER — PANTOPRAZOLE SODIUM 40 MG PO TBEC: 40.0000 mg | DELAYED_RELEASE_TABLET | Freq: Every day | ORAL | 0 refills | Status: DC

## 2016-05-09 NOTE — Patient Instructions (Addendum)
Sent referral to orthopedics for chronic back pain Sent referral to nutrition and diabetes for weight gain and obesity Will start a trial of Protonix for GERD symptoms Recommend a lowfat, low carbohydrate diet divided over 5-6 small meals, increase water intake to 6-8 glasses, and 150 minutes per week of cardiovascular exercise.   Obesity, Adult Obesity is the condition of having too much total body fat. Being overweight or obese means that your weight is greater than what is considered healthy for your body size. Obesity is determined by a measurement called BMI. BMI is an estimate of body fat and is calculated from height and weight. For adults, a BMI of 30 or higher is considered obese. Obesity can eventually lead to other health concerns and major illnesses, including:  Stroke.  Coronary artery disease (CAD).  Type 2 diabetes.  Some types of cancer, including cancers of the colon, breast, uterus, and gallbladder.  Osteoarthritis.  High blood pressure (hypertension).  High cholesterol.  Sleep apnea.  Gallbladder stones.  Infertility problems. What are the causes? The main cause of obesity is taking in (consuming) more calories than your body uses for energy. Other factors that contribute to this condition may include:  Being born with genes that make you more likely to become obese.  Having a medical condition that causes obesity. These conditions include:  Hypothyroidism.  Polycystic ovarian syndrome (PCOS).  Binge-eating disorder.  Cushing syndrome.  Taking certain medicines, such as steroids, antidepressants, and seizure medicines.  Not being physically active (sedentary lifestyle).  Living where there are limited places to exercise safely or buy healthy foods.  Not getting enough sleep. What increases the risk? The following factors may increase your risk of this condition:  Having a family history of obesity.  Being a woman of African-American  descent.  Being a man of Hispanic descent. What are the signs or symptoms? Having excessive body fat is the main symptom of this condition. How is this diagnosed? This condition may be diagnosed based on:  Your symptoms.  Your medical history.  A physical exam. Your health care provider may measure:  Your BMI. If you are an adult with a BMI between 25 and less than 30, you are considered overweight. If you are an adult with a BMI of 30 or higher, you are considered obese.  The distances around your hips and your waist (circumferences). These may be compared to each other to help diagnose your condition.  Your skinfold thickness. Your health care provider may gently pinch a fold of your skin and measure it. How is this treated? Treatment for this condition often includes changing your lifestyle. Treatment may include some or all of the following:  Dietary changes. Work with your health care provider and a dietitian to set a weight-loss goal that is healthy and reasonable for you. Dietary changes may include eating:  Smaller portions. A portion size is the amount of a particular food that is healthy for you to eat at one time. This varies from person to person.  Low-calorie or low-fat options.  More whole grains, fruits, and vegetables.  Regular physical activity. This may include aerobic activity (cardio) and strength training.  Medicine to help you lose weight. Your health care provider may prescribe medicine if you are unable to lose 1 pound a week after 6 weeks of eating more healthily and doing more physical activity.  Surgery. Surgical options may include gastric banding and gastric bypass. Surgery may be done if:  Other treatments have  not helped to improve your condition.  You have a BMI of 40 or higher.  You have life-threatening health problems related to obesity. Follow these instructions at home:   Eating and drinking  Follow recommendations from your health  care provider about what you eat and drink. Your health care provider may advise you to:  Limit fast foods, sweets, and processed snack foods.  Choose low-fat options, such as low-fat milk instead of whole milk.  Eat 5 or more servings of fruits or vegetables every day.  Eat at home more often. This gives you more control over what you eat.  Choose healthy foods when you eat out.  Learn what a healthy portion size is.  Keep low-fat snacks on hand.  Avoid sugary drinks, such as soda, fruit juice, iced tea sweetened with sugar, and flavored milk.  Eat a healthy breakfast.  Drink enough water to keep your urine clear or pale yellow.  Do not go without eating for long periods of time (do not fast) or follow a fad diet. Fasting and fad diets can be unhealthy and even dangerous. Physical Activity  Exercise regularly, as told by your health care provider. Ask your health care provider what types of exercise are safe for you and how often you should exercise.  Warm up and stretch before being active.  Cool down and stretch after being active.  Rest between periods of activity. Lifestyle  Limit the time that you spend in front of your TV, computer, or video game system.  Find ways to reward yourself that do not involve food.  Limit alcohol intake to no more than 1 drink a day for nonpregnant women and 2 drinks a day for men. One drink equals 12 oz of beer, 5 oz of wine, or 1 oz of hard liquor. General instructions  Keep a weight loss journal to keep track of the food you eat and how much you exercise you get.  Take over-the-counter and prescription medicines only as told by your health care provider.  Take vitamins and supplements only as told by your health care provider.  Consider joining a support group. Your health care provider may be able to recommend a support group.  Keep all follow-up visits as told by your health care provider. This is important. Contact a health  care provider if:  You are unable to meet your weight loss goal after 6 weeks of dietary and lifestyle changes. This information is not intended to replace advice given to you by your health care provider. Make sure you discuss any questions you have with your health care provider. Document Released: 06/21/2004 Document Revised: 10/17/2015 Document Reviewed: 03/02/2015 Elsevier Interactive Patient Education  2017 [employer].  Food Choices for Gastroesophageal Reflux Disease, Adult When you have gastroesophageal reflux disease (GERD), the foods you eat and your eating habits are very important. Choosing the right foods can help ease your discomfort. What guidelines do I need to follow?  Choose fruits, vegetables, whole grains, and low-fat dairy products.  Choose low-fat meat, fish, and poultry.  Limit fats such as oils, salad dressings, butter, nuts, and avocado.  Keep a food diary. This helps you identify foods that cause symptoms.  Avoid foods that cause symptoms. These may be different for everyone.  Eat small meals often instead of 3 large meals a day.  Eat your meals slowly, in a place where you are relaxed.  Limit fried foods.  Cook foods using methods other than frying.  Avoid drinking  alcohol.  Avoid drinking large amounts of liquids with your meals.  Avoid bending over or lying down until 2-3 hours after eating. What foods are not recommended? These are some foods and drinks that may make your symptoms worse: Vegetables  Tomatoes. Tomato juice. Tomato and spaghetti sauce. Chili peppers. Onion and garlic. Horseradish. Fruits  Oranges, grapefruit, and lemon (fruit and juice). Meats  High-fat meats, fish, and poultry. This includes hot dogs, ribs, ham, sausage, salami, and bacon. Dairy  Whole milk and chocolate milk. Sour cream. Cream. Butter. Ice cream. Cream cheese. Drinks  Coffee and tea. Bubbly (carbonated) drinks or energy drinks. Condiments  Hot sauce.  Barbecue sauce. Sweets/Desserts  Chocolate and cocoa. Donuts. Peppermint and spearmint. Fats and Oils  High-fat foods. This includes [place] fries and potato chips. Other  Vinegar. Strong spices. This includes black pepper, white pepper, red pepper, cayenne, curry powder, cloves, ginger, and chili powder. The items listed above may not be a complete list of foods and drinks to avoid. Contact your dietitian for more information.  This information is not intended to replace advice given to you by your health care provider. Make sure you discuss any questions you have with your health care provider. Document Released: 11/13/2011 Document Revised: 10/20/2015 Document Reviewed: 03/18/2013 Elsevier Interactive Patient Education  2017 [employer]

## 2016-05-09 NOTE — Progress Notes (Signed)
Subjective:    Patient ID: Frances Dudley, female    DOB: 08-24-1985, 30 y.o.   MRN: [id number]  Cough  This is a new problem. The current episode started 1 to 4 weeks ago (3 weeks ago). The problem has been unchanged. The cough is productive of sputum. Associated symptoms include heartburn and postnasal drip. Pertinent negatives include no chest pain, chills, ear congestion, fever, headaches, rhinorrhea, sore throat, shortness of breath, sweats, weight loss or wheezing. The symptoms are aggravated by lying down. She has tried OTC cough suppressant for the symptoms. There is no history of asthma, bronchiectasis, bronchitis, COPD, emphysema, environmental allergies or pneumonia.  Gastroesophageal Reflux  She complains of abdominal pain, belching, coughing, early satiety, heartburn and nausea. She reports no chest pain, no sore throat or no wheezing. This is a recurrent problem. The problem occurs rarely. The heartburn duration is an hour. The heartburn is located in the substernum. The heartburn is of severe intensity. The heartburn wakes her from sleep. The heartburn limits her activity. The heartburn doesn't change with position. The symptoms are aggravated by certain foods. Pertinent negatives include no weight loss. Risk factors include obesity, smoking/tobacco exposure, NSAIDs, caffeine use and ETOH use.  Back Pain  This is a chronic problem. The current episode started more than 1 year ago. The problem occurs every several days. The pain is present in the lumbar spine. The pain is at a severity of 10/10. The pain is severe. Associated symptoms include abdominal pain, numbness and tingling. Pertinent negatives include no bowel incontinence, chest pain, dysuria, fever, headaches, leg pain, weakness or weight loss. She has tried NSAIDs and heat for the symptoms. The treatment provided no relief.   Past Medical History:  Diagnosis Date  . Chronic migraine without aura or status migrainosus  04/12/2014  . Depression   . Fall   . Fibromyalgia 04/12/2014  . Migraine   . Multiple sclerosis (HCC)   . Pseudotumor cerebri 03/23/2016  . TB lung, latent    Immunization History  Administered Date(s) Administered  . Influenza,inj,Quad PF,36+ Mos 03/22/2015, 02/24/2016   Social History   Social History  . Marital status: Single    Spouse name: N/A  . Number of children: 1  . Years of education: some college   Occupational History  . unemployed    Social History Main Topics  . Smoking status: Never Smoker  . Smokeless tobacco: Never Used  . Alcohol use No     Comment: occ  . Drug use: No  . Sexual activity: Yes    Birth control/ protection: None   Other Topics Concern  . Not on file   Social History Narrative   Patient is adopted.   Right-handed   Caffeine: 2 per day   Review of Systems  Constitutional: Negative.  Negative for chills, fever and weight loss.  HENT: Positive for congestion and postnasal drip. Negative for rhinorrhea, sinus pain, sneezing and sore throat.   Eyes: Negative.   Respiratory: Positive for cough. Negative for shortness of breath and wheezing.   Cardiovascular: Negative for chest pain.  Gastrointestinal: Positive for abdominal pain, heartburn, nausea and vomiting. Negative for bowel incontinence and diarrhea.  Endocrine: Negative.   Genitourinary: Negative.  Negative for dysuria.  Musculoskeletal: Positive for back pain. Negative for joint pain.  Allergic/Immunologic: Negative for environmental allergies.  Neurological: Positive for tingling and numbness. Negative for weakness and headaches.  Hematological: Negative.   Psychiatric/Behavioral: Negative.       Objective:  Physical Exam  Constitutional: She is oriented to person, place, and time. She appears well-developed and well-nourished.  HENT:  Head: Normocephalic and atraumatic.  Right Ear: External ear normal.  Left Ear: External ear normal.  Mouth/Throat: Oropharynx is  clear and moist.  Eyes: Conjunctivae and EOM are normal. Pupils are equal, round, and reactive to light.  Neck: Normal range of motion. Neck supple.  Cardiovascular: Normal rate, regular rhythm, normal heart sounds and intact distal pulses.   Pulmonary/Chest: Effort normal. She has no decreased breath sounds. She has rhonchi in the right upper field and the left upper field.  Abdominal: Soft. Bowel sounds are normal.  Musculoskeletal: Normal range of motion.  Neurological: She is alert and oriented to person, place, and time. She has normal reflexes.  Skin: Skin is warm and dry.  Psychiatric: She has a normal mood and affect. Her behavior is normal. Judgment and thought content normal.      BP 108/60 (BP Location: Left Arm, Patient Position: Sitting, Cuff Size: Large)   Pulse 88   Temp 98.1 F (36.7 C) (Oral)   Resp 16   Ht 5\' 2"  (1.575 m)   Wt 243 lb (110.2 kg)   LMP 04/17/2016   SpO2 100%   BMI 44.45 kg/m  Assessment & Plan:  1. Gastroesophageal reflux disease without esophagitis The patient is asked to make an attempt to improve diet and exercise patterns to aid in medical management of this problem. - pantoprazole (PROTONIX) 40 MG tablet; Take 1 tablet (40 mg total) by mouth daily.  Dispense: 30 tablet; Refill: 0  2. Obesity, unspecified classification, unspecified obesity type, unspecified whether serious comorbidity present Recommend a lowfat, low carbohydrate diet divided over 5-6 small meals, increase water intake to 6-8 glasses, and 150 minutes per week of cardiovascular exercise.   - TSH - COMPLETE METABOLIC PANEL WITH GFR - Ambulatory referral to Nutrition and Diabetic Education - HgB A1c  3. Chronic midline low back pain without sciatica Recommend that patient may an effort to decrease back pain. Reviewed previous CT of lumbar spine. Benign scoliosis present - Ambulatory referral to Sports Medicine  4. Runny nose - Chlorphen-PE-Acetaminophen (NOREL AD) 4-10-325 MG  TABS; Take 1 tablet by mouth every 6 (six) hours as needed.  Dispense: 20 tablet; Refill: 0  5. Cough  - Chlorphen-PE-Acetaminophen (NOREL AD) 4-10-325 MG TABS; Take 1 tablet by mouth every 6 (six) hours as needed.  Dispense: 20 tablet; Refill: 0   The patient was given clear instructions to go to ER or return to medical center if symptoms do not improve, worsen or new problems develop. The patient verbalized understanding. Will notify patient with laboratory results.      Massie Maroon, FNP

## 2016-05-15 ENCOUNTER — Ambulatory Visit (INDEPENDENT_AMBULATORY_CARE_PROVIDER_SITE_OTHER): Payer: Medicaid Other | Admitting: Obstetrics

## 2016-05-15 ENCOUNTER — Encounter: Payer: Self-pay | Admitting: Obstetrics

## 2016-05-15 ENCOUNTER — Other Ambulatory Visit (HOSPITAL_COMMUNITY)
Admission: RE | Admit: 2016-05-15 | Discharge: 2016-05-15 | Disposition: A | Discharge status: Home / Self Care | Payer: Medicaid Other | Source: Ambulatory Visit | Attending: Obstetrics | Admitting: Obstetrics

## 2016-05-15 ENCOUNTER — Encounter: Payer: Self-pay | Admitting: *Deleted

## 2016-05-15 VITALS — BP 110/80 | HR 88 | Temp 98.1°F | Wt 235.3 lb

## 2016-05-15 DIAGNOSIS — Z0000 Encounter for general adult medical examination without abnormal findings: Secondary | ICD-10-CM

## 2016-05-15 DIAGNOSIS — Z01419 Encounter for gynecological examination (general) (routine) without abnormal findings: Secondary | ICD-10-CM

## 2016-05-15 DIAGNOSIS — Z1151 Encounter for screening for human papillomavirus (HPV): Secondary | ICD-10-CM | POA: Insufficient documentation

## 2016-05-15 DIAGNOSIS — Z3009 Encounter for other general counseling and advice on contraception: Secondary | ICD-10-CM

## 2016-05-15 DIAGNOSIS — Z124 Encounter for screening for malignant neoplasm of cervix: Secondary | ICD-10-CM

## 2016-05-15 NOTE — Progress Notes (Signed)
Subjective:        Frances Dudley is a 30 y.o. female here for a routine exam.  Current complaints: None.    Personal health questionnaire:  Is patient Ashkenazi Jewish, have a family history of breast and/or ovarian cancer: mgm and mggm  Is there a family history of uterine cancer diagnosed at age < 58, gastrointestinal cancer, urinary tract cancer, family member who is a Personnel officer syndrome-associated carrier: no Is the patient overweight and hypertensive, family history of diabetes, personal history of gestational diabetes, preeclampsia or PCOS: no Is patient over 33, have PCOS,  family history of premature CHD under age 34, diabetes, smoke, have hypertension or peripheral artery disease:  no At any time, has a partner hit, kicked or otherwise hurt or frightened you?: no Over the past 2 weeks, have you felt down, depressed or hopeless?: no Over the past 2 weeks, have you felt little interest or pleasure in doing things?:no   Gynecologic History Patient's last menstrual period was 04/17/2016. Contraception: none Last Pap: 2016. Results were: normal Last mammogram: n/a. Results were: n/a  Obstetric History OB History  Gravida Para Term Preterm AB Living  1         1  SAB TAB Ectopic Multiple Live Births          1    # Outcome Date GA Lbr Len/2nd Weight Sex Delivery Anes PTL Lv  1 Gravida 03/16/07    F Vag-Spont   LIV      Past Medical History:  Diagnosis Date  . Chronic migraine without aura or status migrainosus 04/12/2014  . Depression   . Fall   . Fibromyalgia 04/12/2014  . Migraine   . Multiple sclerosis (HCC)   . Pseudotumor cerebri 03/23/2016  . TB lung, latent     Past Surgical History:  Procedure Laterality Date  . NONE       Current Outpatient Prescriptions:  .  amphetamine-dextroamphetamine (ADDERALL) 10 MG tablet, Take 1 tablet (10 mg total) by mouth 2 (two) times daily with a meal., Disp: 60 tablet, Rfl: 0 .  Botulinum Toxin Type A (BOTOX) 200  UNITS SOLR, Inject 100 Units as directed once., Disp: 2 each, Rfl: 0 .  Chlorphen-PE-Acetaminophen (NOREL AD) 4-10-325 MG TABS, Take 1 tablet by mouth every 6 (six) hours as needed., Disp: 20 tablet, Rfl: 0 .  Cholecalciferol (VITAMIN D) 2000 UNITS CAPS, Take 6,000 Units by mouth daily. , Disp: , Rfl:  .  citalopram (CELEXA) 40 MG tablet, Take 1 tablet (40 mg total) by mouth daily., Disp: 90 tablet, Rfl: 3 .  fluticasone (FLONASE) 50 MCG/ACT nasal spray, Place 2 sprays into both nostrils daily. X 14 days, Disp: 16 g, Rfl: 0 .  furosemide (LASIX) 20 MG tablet, Take 1 tablet (20 mg total) by mouth daily., Disp: 30 tablet, Rfl: 3 .  ibuprofen (ADVIL,MOTRIN) 800 MG tablet, Take 800 mg by mouth 2 (two) times daily., Disp: , Rfl:  .  loratadine (CLARITIN) 10 MG tablet, Take 1 tablet (10 mg total) by mouth daily., Disp: 30 tablet, Rfl: 2 .  Melatonin 5 MG TABS, Take by mouth., Disp: , Rfl:  .  modafinil (PROVIGIL) 200 MG tablet, Take 1 tablet (200 mg total) by mouth daily., Disp: 30 tablet, Rfl: 3 .  natalizumab (TYSABRI) 300 MG/15ML injection, Inject into the vein. Every 28 days, Disp: , Rfl:  .  ondansetron (ZOFRAN) 4 MG tablet, Take 1 tablet (4 mg total) by mouth every 6 (six) hours  as needed for nausea or vomiting. (Patient not taking: Reported on 05/09/2016), Disp: 20 tablet, Rfl: 0 .  oxyCODONE-acetaminophen (PERCOCET) 10-325 MG tablet, Take 1 tablet by mouth every 8 (eight) hours., Disp: , Rfl: 0 .  pantoprazole (PROTONIX) 40 MG tablet, Take 1 tablet (40 mg total) by mouth daily., Disp: 30 tablet, Rfl: 0 .  potassium chloride (K-DUR) 10 MEQ tablet, Take 2 tablets (20 mEq total) by mouth daily., Disp: 60 tablet, Rfl: 3 .  pregabalin (LYRICA) 100 MG capsule, Take 1 capsule (100 mg total) by mouth 2 (two) times daily., Disp: 90 capsule, Rfl: 5 .  pyridOXINE (VITAMIN B-6) 100 MG tablet, Take 100 mg by mouth at bedtime. , Disp: , Rfl:  .  QUEtiapine (SEROQUEL) 100 MG tablet, Take 1 tablet (100 mg total)  by mouth at bedtime., Disp: 30 tablet, Rfl: 5  Current Facility-Administered Medications:  .  botulinum toxin Type A (BOTOX) injection 200 Units, 200 Units, Intramuscular, Once, [place] Spanielharles K Willis, MD Allergies  Allergen Reactions  . Amitriptyline   . Baclofen     Headache, nausea  . Chlorhexidine Gluconate Itching     CHG wipe causes skin to be red and itchy - use alcohol when starting iv site   . Diamox [Acetazolamide]     Weakness, stomach upset  . Diazepam Nausea Only  . Sumatriptan Nausea And Vomiting and Other (See Comments)    lockjaw  . Zonegran [Zonisamide]     Intolerance     Social History  Substance Use Topics  . Smoking status: Never Smoker  . Smokeless tobacco: Never Used  . Alcohol use No     Comment: occ    Family History  Problem Relation Age of Onset  . Adopted: Yes  . Fibromyalgia Mother       Review of Systems  Constitutional: negative for fatigue and weight loss Respiratory: negative for cough and wheezing Cardiovascular: negative for chest pain, fatigue and palpitations Gastrointestinal: negative for abdominal pain and change in bowel habits Musculoskeletal:negative for myalgias Neurological: negative for gait problems and tremors Behavioral/Psych: negative for abusive relationship, depression Endocrine: negative for temperature intolerance    Genitourinary:negative for abnormal menstrual periods, genital lesions, hot flashes, sexual problems and vaginal discharge Integument/breast: negative for breast lump, breast tenderness, nipple discharge and skin lesion(s)    Objective:       BP 110/80   Pulse 88   Temp 98.1 F (36.7 C) (Oral)   Wt 235 lb 4.8 oz (106.7 kg)   LMP 04/17/2016   BMI 43.04 kg/m  General:   alert  Skin:   no rash or abnormalities  Lungs:   clear to auscultation bilaterally  Heart:   regular rate and rhythm, S1, S2 normal, no murmur, click, rub or gallop  Breasts:   normal without suspicious masses, skin or nipple  changes or axillary nodes  Abdomen:  normal findings: no organomegaly, soft, non-tender and no hernia  Pelvis:  External genitalia: normal general appearance Urinary system: urethral meatus normal and bladder without fullness, nontender Vaginal: normal without tenderness, induration or masses Cervix: normal appearance Adnexa: normal bimanual exam Uterus: anteverted and non-tender, normal size   Lab Review Urine pregnancy test Labs reviewed yes Radiologic studies reviewed no  50% of 20 min visit spent on counseling and coordination of care.    Assessment:    Healthy female exam.   Contraceptive Counseling and Advice.  Declines contraception. Plan:    Education reviewed: low fat, low cholesterol diet, safe sex/STD  prevention, self breast exams and weight bearing exercise. Contraception: none. Follow up in: 1 years.   No orders of the defined types were placed in this encounter.  Orders Placed This Encounter  Procedures  . NuSwab Vaginitis Plus (VG+)     Patient ID: Frances Dudley, female   DOB: 05/16/1986, 30 y.o.   MRN: [id number]

## 2016-05-17 LAB — CYTOLOGY - PAP
DIAGNOSIS: NEGATIVE
HPV (WINDOPATH): NOT DETECTED

## 2016-05-19 LAB — NUSWAB VAGINITIS PLUS (VG+)
Candida albicans, NAA: POSITIVE — AB
Candida glabrata, NAA: NEGATIVE
Chlamydia trachomatis, NAA: NEGATIVE
Neisseria gonorrhoeae, NAA: NEGATIVE
Trich vag by NAA: NEGATIVE

## 2016-05-23 ENCOUNTER — Other Ambulatory Visit: Payer: Self-pay | Admitting: Obstetrics

## 2016-05-23 DIAGNOSIS — B373 Candidiasis of vulva and vagina: Secondary | ICD-10-CM

## 2016-05-23 DIAGNOSIS — B3731 Acute candidiasis of vulva and vagina: Secondary | ICD-10-CM

## 2016-05-23 MED ORDER — FLUCONAZOLE 150 MG PO TABS: 150.0000 mg | ORAL_TABLET | Freq: Once | ORAL | 2 refills | Status: AC

## 2016-06-04 ENCOUNTER — Other Ambulatory Visit: Payer: Self-pay | Admitting: Neurology

## 2016-06-04 ENCOUNTER — Other Ambulatory Visit: Payer: Self-pay | Admitting: Family Medicine

## 2016-06-04 DIAGNOSIS — K219 Gastro-esophageal reflux disease without esophagitis: Secondary | ICD-10-CM

## 2016-06-04 DIAGNOSIS — G35 Multiple sclerosis: Secondary | ICD-10-CM

## 2016-06-05 ENCOUNTER — Telehealth: Payer: Self-pay

## 2016-06-05 NOTE — Telephone Encounter (Signed)
New Tysabri orders faxed to [facility] F # [phone].

## 2016-06-05 NOTE — Telephone Encounter (Signed)
-----   Message from Katherene Ponto, [place] sent at 06/04/2016  1:54 PM EST ----- Regarding: Request for New Orders Contact: [phone] Good afternoon. Happy New Year! This message is being sent to request Tysabri orders for Frances Dudley's next infusion please. Thank you in advance.  [employer]

## 2016-06-07 ENCOUNTER — Encounter (HOSPITAL_COMMUNITY)
Admission: RE | Admit: 2016-06-07 | Discharge: 2016-06-07 | Disposition: A | Discharge status: Home / Self Care | Payer: Medicaid Other | Source: Ambulatory Visit | Attending: Neurology | Admitting: Neurology

## 2016-06-07 DIAGNOSIS — G35 Multiple sclerosis: Secondary | ICD-10-CM | POA: Insufficient documentation

## 2016-06-13 ENCOUNTER — Ambulatory Visit: Payer: Medicaid Other | Admitting: Skilled Nursing Facility1

## 2016-06-15 ENCOUNTER — Ambulatory Visit: Payer: Medicaid Other | Admitting: Family Medicine

## 2016-06-15 ENCOUNTER — Telehealth: Payer: Self-pay | Admitting: Neurology

## 2016-06-15 DIAGNOSIS — M797 Fibromyalgia: Secondary | ICD-10-CM

## 2016-06-15 DIAGNOSIS — G43719 Chronic migraine without aura, intractable, without status migrainosus: Secondary | ICD-10-CM

## 2016-06-15 DIAGNOSIS — R55 Syncope and collapse: Secondary | ICD-10-CM

## 2016-06-15 DIAGNOSIS — G35 Multiple sclerosis: Secondary | ICD-10-CM

## 2016-06-15 MED ORDER — ONABOTULINUMTOXINA 200 UNITS IJ SOLR: 100.0000 [IU] | Freq: Once | INTRAMUSCULAR | 3 refills | Status: AC

## 2016-06-15 NOTE — Telephone Encounter (Signed)
Frances Dudley, would you mind sending an rx for the botox 100 unit vial qty of 2 to [facility]?   [facility] [place].

## 2016-06-15 NOTE — Telephone Encounter (Signed)
Rx e-scribed to specialty pharmacy as requested.

## 2016-06-20 ENCOUNTER — Telehealth: Payer: Self-pay | Admitting: Neurology

## 2016-06-20 ENCOUNTER — Ambulatory Visit (INDEPENDENT_AMBULATORY_CARE_PROVIDER_SITE_OTHER): Payer: Medicaid Other | Admitting: Neurology

## 2016-06-20 ENCOUNTER — Encounter: Payer: Self-pay | Admitting: Neurology

## 2016-06-20 VITALS — BP 107/75 | HR 90 | Ht 62.0 in | Wt 241.0 lb

## 2016-06-20 DIAGNOSIS — G43719 Chronic migraine without aura, intractable, without status migrainosus: Secondary | ICD-10-CM | POA: Diagnosis not present

## 2016-06-20 MED ORDER — PREGABALIN 100 MG PO CAPS: 100.0000 mg | ORAL_CAPSULE | Freq: Two times a day (BID) | ORAL | 2 refills | Status: DC

## 2016-06-20 NOTE — Procedures (Signed)
     BOTOX PROCEDURE NOTE FOR MIGRAINE HEADACHE   HISTORY: Frances Dudley is a 31 year old patient with a history of multiple sclerosis and intractable migraine headache. The patient arms in for a Botox treatment today. She indicates that the Botox dramatically improves the headache frequency and severity for about 2 months, but about 4 weeks prior to her next injection the headaches return and become daily in nature. The patient is here for her next Botox treatment.   Description of procedure:  The patient was placed in a sitting position. The standard protocol was used for Botox as follows, with 5 units of Botox injected at each site:   -Procerus muscle, midline injection  -Corrugator muscle, bilateral injection  -Frontalis muscle, bilateral injection, with 2 sites each side, medial injection was performed in the upper one third of the frontalis muscle, in the region vertical from the medial inferior edge of the superior orbital rim. The lateral injection was again in the upper one third of the forehead vertically above the lateral limbus of the cornea, 1.5 cm lateral to the medial injection site.  -Temporalis muscle injection, 4 sites, bilaterally. The first injection was 3 cm above the tragus of the ear, second injection site was 1.5 cm to 3 cm up from the first injection site in line with the tragus of the ear. The third injection site was 1.5-3 cm forward between the first 2 injection sites. The fourth injection site was 1.5 cm posterior to the second injection site.  -Occipitalis muscle injection, 3 sites, bilaterally. The first injection was done one half way between the occipital protuberance and the tip of the mastoid process behind the ear. The second injection site was done lateral and superior to the first, 1 fingerbreadth from the first injection. The third injection site was 1 fingerbreadth superiorly and medially from the first injection site.  -Cervical paraspinal muscle  injection, 2 sites, bilateral, the first injection site was 1 cm from the midline of the cervical spine, 3 cm inferior to the lower border of the occipital protuberance. The second injection site was 1.5 cm superiorly and laterally to the first injection site.  -Trapezius muscle injection was performed at 3 sites, bilaterally. The first injection site was in the upper trapezius muscle halfway between the inflection point of the neck, and the acromion. The second injection site was one half way between the acromion and the first injection site. The third injection was done between the first injection site and the inflection point of the neck.   A 200 unit bottle of Botox was used, 155 units were injected, the rest of the Botox was wasted. The patient tolerated the procedure well, there were no complications of the above procedure.  Botox NDC 0947-0962-83 Lot number [id number] Expiration date July 2020

## 2016-06-20 NOTE — Telephone Encounter (Signed)
PLEASE SCHEDULE 90 DAY BOTOX PER DR WILLIS.

## 2016-06-20 NOTE — Progress Notes (Signed)
Botox 100 units/vial x 2 from [facility] [phone]  Lot [id number]  Exp 07 2020

## 2016-06-21 NOTE — Telephone Encounter (Signed)
Called to schedule patient for injection, she did not answer so I left her a VM asking her to call me back.

## 2016-06-22 NOTE — Telephone Encounter (Signed)
[facility] Program authorization is valid from 07/15/2016 through 01/13/2017. [employer] T # [phone]  Patient Enrollment Number: [id number]  Approval faxed to [facility]   T # [phone] F # [phone]

## 2016-06-27 ENCOUNTER — Other Ambulatory Visit: Payer: Self-pay | Admitting: Family Medicine

## 2016-06-27 DIAGNOSIS — K219 Gastro-esophageal reflux disease without esophagitis: Secondary | ICD-10-CM

## 2016-06-27 MED ORDER — PREDNISONE 5 MG PO TABS: ORAL_TABLET | ORAL | 0 refills | Status: DC

## 2016-06-27 MED ORDER — DEXLANSOPRAZOLE 30 MG PO CPDR: 30.0000 mg | DELAYED_RELEASE_CAPSULE | Freq: Every day | ORAL | 0 refills | Status: DC

## 2016-06-27 NOTE — Telephone Encounter (Signed)
Pt called said she is experiencing light headedness, nauseated, hard to keep balance with standing which started on Sunday (1/28)or Monday (1/29). She said her head is constantly pounding since last botox injection 1/24 with sharp pain across the forehead. She is wanting to know if this is normal. She is wanting to speak with RN before scheduling next botox appt. She can be reached at [phone]

## 2016-06-27 NOTE — Telephone Encounter (Signed)
I called patient. The patient had an increase in her headaches some sharp shooting pains in the frontal areas following the Botox. Occasionally, patients will save the Botox worsens the headache for a week or 2 before it starts working. I will give the patient a 6 day prednisone Dosepak to see if this helps.

## 2016-06-27 NOTE — Telephone Encounter (Signed)
Returned pt TC. Says that she started having shooting pains in her forehead shortly after getting her Botox injections on 06/20/16. She's also had dizziness and nausea w/ vomiting this week. Denies fever. Although pt has not experienced w/ previous injections, side effects of Botox can include headache, nausea and dizziness. However, pt also missed her Tysabri treatment earlier this month. She has been r/s for next infusion on 07/05/16.

## 2016-06-28 ENCOUNTER — Encounter: Payer: Self-pay | Admitting: Nurse Practitioner

## 2016-06-28 ENCOUNTER — Ambulatory Visit (INDEPENDENT_AMBULATORY_CARE_PROVIDER_SITE_OTHER): Payer: Medicaid Other | Admitting: Family Medicine

## 2016-06-28 ENCOUNTER — Encounter: Payer: Self-pay | Admitting: Family Medicine

## 2016-06-28 VITALS — BP 103/74 | HR 96 | Temp 97.6°F | Resp 16 | Ht 62.0 in | Wt 242.0 lb

## 2016-06-28 DIAGNOSIS — K219 Gastro-esophageal reflux disease without esophagitis: Secondary | ICD-10-CM

## 2016-06-28 DIAGNOSIS — R3911 Hesitancy of micturition: Secondary | ICD-10-CM

## 2016-06-28 MED ORDER — PREDNISONE 5 MG PO TABS: ORAL_TABLET | ORAL | 0 refills | Status: DC

## 2016-06-28 NOTE — Addendum Note (Signed)
Addended by: Donnelly Angelica on: 06/28/2016 01:21 PM   Modules accepted: Orders

## 2016-06-28 NOTE — Patient Instructions (Addendum)
Call neurology to schedule a follow up appointment for RLS.  Will send a referral to gastroenterology.  Increase water intake to 6-8 glasses per day. Assess whether urinary hesitancy improves with increasing water intake.  Follow up in 6 months for chronic conditions or as needed.  Will follow up by phone with potential referral for breast reduction.   Food Choices for Gastroesophageal Reflux Disease, Adult When you have gastroesophageal reflux disease (GERD), the foods you eat and your eating habits are very important. Choosing the right foods can help ease your discomfort. What guidelines do I need to follow?  Choose fruits, vegetables, whole grains, and low-fat dairy products.  Choose low-fat meat, fish, and poultry.  Limit fats such as oils, salad dressings, butter, nuts, and avocado.  Keep a food diary. This helps you identify foods that cause symptoms.  Avoid foods that cause symptoms. These may be different for everyone.  Eat small meals often instead of 3 large meals a day.  Eat your meals slowly, in a place where you are relaxed.  Limit fried foods.  Cook foods using methods other than frying.  Avoid drinking alcohol.  Avoid drinking large amounts of liquids with your meals.  Avoid bending over or lying down until 2-3 hours after eating. What foods are not recommended? These are some foods and drinks that may make your symptoms worse: Vegetables  Tomatoes. Tomato juice. Tomato and spaghetti sauce. Chili peppers. Onion and garlic. Horseradish. Fruits  Oranges, grapefruit, and lemon (fruit and juice). Meats  High-fat meats, fish, and poultry. This includes hot dogs, ribs, ham, sausage, salami, and bacon. Dairy  Whole milk and chocolate milk. Sour cream. Cream. Butter. Ice cream. Cream cheese. Drinks  Coffee and tea. Bubbly (carbonated) drinks or energy drinks. Condiments  Hot sauce. Barbecue sauce. Sweets/Desserts  Chocolate and cocoa. Donuts. Peppermint and  spearmint. Fats and Oils  High-fat foods. This includes [place] fries and potato chips. Other  Vinegar. Strong spices. This includes black pepper, white pepper, red pepper, cayenne, curry powder, cloves, ginger, and chili powder. The items listed above may not be a complete list of foods and drinks to avoid. Contact your dietitian for more information.  This information is not intended to replace advice given to you by your health care provider. Make sure you discuss any questions you have with your health care provider. Document Released: 11/13/2011 Document Revised: 10/20/2015 Document Reviewed: 03/18/2013 Elsevier Interactive Patient Education  2017 Elsevier Inc.  Gastroesophageal Reflux Disease, Adult Introduction Normally, food travels down the esophagus and stays in the stomach to be digested. If a person has gastroesophageal reflux disease (GERD), food and stomach acid move back up into the esophagus. When this happens, the esophagus becomes sore and swollen (inflamed). Over time, GERD can make small holes (ulcers) in the lining of the esophagus. Follow these instructions at home: Diet  Follow a diet as told by your doctor. You may need to avoid foods and drinks such as:  Coffee and tea (with or without caffeine).  Drinks that contain alcohol.  Energy drinks and sports drinks.  Carbonated drinks or sodas.  Chocolate and cocoa.  Peppermint and mint flavorings.  Garlic and onions.  Horseradish.  Spicy and acidic foods, such as peppers, chili powder, curry powder, vinegar, hot sauces, and BBQ sauce.  Citrus fruit juices and citrus fruits, such as oranges, lemons, and limes.  Tomato-based foods, such as red sauce, chili, salsa, and pizza with red sauce.  Fried and fatty foods, such as donuts,  french fries, potato chips, and high-fat dressings.  High-fat meats, such as hot dogs, rib eye steak, sausage, ham, and bacon.  High-fat dairy items, such as whole milk, butter,  and cream cheese.  Eat small meals often. Avoid eating large meals.  Avoid drinking large amounts of liquid with your meals.  Avoid eating meals during the 2-3 hours before bedtime.  Avoid lying down right after you eat.  Do not exercise right after you eat. General instructions  Pay attention to any changes in your symptoms.  Take over-the-counter and prescription medicines only as told by your doctor. Do not take aspirin, ibuprofen, or other NSAIDs unless your doctor says it is okay.  Do not use any tobacco products, including cigarettes, chewing tobacco, and e-cigarettes. If you need help quitting, ask your doctor.  Wear loose clothes. Do not wear anything tight around your waist.  Raise (elevate) the head of your bed about 6 inches (15 cm).  Try to lower your stress. If you need help doing this, ask your doctor.  If you are overweight, lose an amount of weight that is healthy for you. Ask your doctor about a safe weight loss goal.  Keep all follow-up visits as told by your doctor. This is important. Contact a doctor if:  You have new symptoms.  You lose weight and you do not know why it is happening.  You have trouble swallowing, or it hurts to swallow.  You have wheezing or a cough that keeps happening.  Your symptoms do not get better with treatment.  You have a hoarse voice. Get help right away if:  You have pain in your arms, neck, jaw, teeth, or back.  You feel sweaty, dizzy, or light-headed.  You have chest pain or shortness of breath.  You throw up (vomit) and your throw up looks like blood or coffee grounds.  You pass out (faint).  Your poop (stool) is bloody or black.  You cannot swallow, drink, or eat. This information is not intended to replace advice given to you by your health care provider. Make sure you discuss any questions you have with your health care provider. Document Released: 10/31/2007 Document Revised: 10/20/2015 Document Reviewed:  09/08/2014  2017 Elsevier

## 2016-06-28 NOTE — Progress Notes (Signed)
Subjective:    Patient ID: Frances Dudley, female    DOB: 1985/07/05, 31 y.o.   MRN: [id number]  Gastroesophageal Reflux  She complains of belching, early satiety and heartburn. She reports no abdominal pain, no chest pain, no choking, no coughing, no dysphagia, no globus sensation, no hoarse voice, no nausea, no sore throat, no stridor, no tooth decay, no water brash or no wheezing. This is a recurrent problem. The current episode started more than 1 month ago. The problem has been unchanged. The symptoms are aggravated by certain foods and lying down. Pertinent negatives include no anemia, fatigue, melena, muscle weakness, orthopnea or weight loss. Risk factors include obesity. She has tried a PPI (Patient was given a trial of Protonix 6 weeks ago without sustained relief. ) for the symptoms. Past procedures do not include an abdominal ultrasound, an EGD, esophageal manometry, esophageal pH monitoring, H. pylori antibody titer or a UGI.  Foot Pain  This is a new (Patient is complaining of twitching to feet primarily at night) problem. The current episode started yesterday. The problem occurs intermittently. The problem has been unchanged. Associated symptoms include myalgias (History of MS). Pertinent negatives include no abdominal pain, change in bowel habit, chest pain, coughing, fatigue, nausea, rash, sore throat, swollen glands, urinary symptoms, vertigo, visual change or vomiting. The symptoms are aggravated by standing and walking. She has tried ice and rest for the symptoms. The treatment provided no relief.   Past Medical History:  Diagnosis Date  . Chronic migraine without aura or status migrainosus 04/12/2014  . Depression   . Fall   . Fibromyalgia 04/12/2014  . Migraine   . Multiple sclerosis (HCC)   . Pseudotumor cerebri 03/23/2016  . TB lung, latent    Social History   Social History  . Marital status: Single    Spouse name: N/A  . Number of children: 1  . Years of  education: some college   Occupational History  . unemployed    Social History Main Topics  . Smoking status: Never Smoker  . Smokeless tobacco: Never Used  . Alcohol use No     Comment: occ  . Drug use: No  . Sexual activity: Yes    Birth control/ protection: None   Other Topics Concern  . Not on file   Social History Narrative   Patient is adopted.   Right-handed   Caffeine: 2 per day   Immunization History  Administered Date(s) Administered  . Influenza,inj,Quad PF,36+ Mos 03/22/2015, 02/24/2016   Review of Systems  Constitutional: Negative.  Negative for fatigue and weight loss.  HENT: Negative.  Negative for hoarse voice and sore throat.   Eyes: Negative.   Respiratory: Negative.  Negative for cough, choking and wheezing.   Cardiovascular: Negative for chest pain.  Gastrointestinal: Positive for heartburn. Negative for abdominal pain, change in bowel habit, dysphagia, melena, nausea and vomiting.  Endocrine: Negative.   Genitourinary: Positive for difficulty urinating (occasional hesitancy).  Musculoskeletal: Positive for myalgias (History of MS). Negative for muscle weakness.       History of multiple sclerosis  Skin: Negative.  Negative for rash.  Allergic/Immunologic: Negative.  Negative for immunocompromised state.  Neurological: Negative.  Negative for vertigo.  Hematological: Negative.   Psychiatric/Behavioral: Negative.        Objective:   Physical Exam  Constitutional: She is oriented to person, place, and time. She appears well-developed and well-nourished.  HENT:  Head: Normocephalic and atraumatic.  Right Ear: External ear normal.  Left Ear: External ear normal.  Nose: Nose normal.  Mouth/Throat: Oropharynx is clear and moist.  Eyes: Conjunctivae and EOM are normal. Pupils are equal, round, and reactive to light.  Neck: Normal range of motion. Neck supple.  Cardiovascular: Normal rate, regular rhythm, normal heart sounds and intact distal  pulses.   Pulmonary/Chest: Effort normal and breath sounds normal.  Abdominal: Soft. Bowel sounds are normal.  Musculoskeletal: Normal range of motion.       Right ankle: She exhibits normal range of motion and no swelling.       Left ankle: She exhibits normal range of motion and no swelling.  Neurological: She is alert and oriented to person, place, and time. She has normal reflexes.  Skin: Skin is warm and dry.  Psychiatric: She has a normal mood and affect. Her behavior is normal. Judgment and thought content normal.     BP 103/74 (BP Location: Right Arm, Patient Position: Sitting, Cuff Size: Large)   Pulse 96   Temp 97.6 F (36.4 C) (Oral)   Resp 16   Ht 5\' 2"  (1.575 m)   Wt 242 lb (109.8 kg)   LMP 06/05/2016   SpO2 100%   BMI 44.26 kg/m  Assessment & Plan:  1. Gastroesophageal reflux disease without esophagitis Started a trial of Dexilant 30 mg. Discussed diet and exercise regimen at length. The patient is asked to make an attempt to improve diet and exercise patterns to aid in medical management of this problem. Recommend a lowfat, low carbohydrate diet divided over 5-6 small meals, increase water intake to 6-8 glasses, and 150 minutes per week of cardiovascular exercise. I will defer to gastroenterology for further work up and evaluation.   - Ambulatory referral to Gastroenterology 2. Urinary hesitancy  - POCT urinalysis dip (device)

## 2016-06-28 NOTE — Telephone Encounter (Signed)
Prednisone rx re-sent to local pharmacy (rather than specialty pharmacy) for pt pick-up.

## 2016-06-29 LAB — POCT URINALYSIS DIP (DEVICE)
BILIRUBIN URINE: NEGATIVE
GLUCOSE, UA: NEGATIVE mg/dL
Hgb urine dipstick: NEGATIVE
KETONES UR: NEGATIVE mg/dL
Leukocytes, UA: NEGATIVE
Nitrite: NEGATIVE
PH: 5.5 (ref 5.0–8.0)
PROTEIN: NEGATIVE mg/dL
Urobilinogen, UA: 0.2 mg/dL (ref 0.0–1.0)

## 2016-07-02 ENCOUNTER — Ambulatory Visit: Payer: Medicaid Other | Admitting: Registered"

## 2016-07-03 ENCOUNTER — Telehealth: Payer: Self-pay | Admitting: Neurology

## 2016-07-03 MED ORDER — PREGABALIN 50 MG PO CAPS: 50.0000 mg | ORAL_CAPSULE | Freq: Two times a day (BID) | ORAL | 3 refills | Status: DC

## 2016-07-03 NOTE — Telephone Encounter (Signed)
Dr Willis- please advise 

## 2016-07-03 NOTE — Telephone Encounter (Addendum)
Faxed printed, signed rx lyrica to pt pharmacy. Fax: [phone]. Received confirmation.

## 2016-07-03 NOTE — Telephone Encounter (Signed)
I called the patient. The patient is having several issues. The patient continues to have ongoing headaches even after the Botox injection. The patient is complaining of burning and stinging in the feet that may be related to the multiple sclerosis. She is also having what sounds like a restless leg type syndrome, jerking of the legs at night.  The patient is on Lyrica taking 100 mg twice daily, we'll go up to 150 mg twice daily. I will call in the 50 mg capsules to add to the 100 mg capsules that she already has.

## 2016-07-03 NOTE — Addendum Note (Signed)
Addended by: Stephanie Acre on: 07/03/2016 04:58 PM   Modules accepted: Orders

## 2016-07-03 NOTE — Telephone Encounter (Signed)
Patient called office in reference to Botox injections she had done on 06/20/16.  Per patient she has not had any relief with headaches, been light headed along with nausea and vomiting.    Per patient she had a visit with her PCP discussed pain and burning with bilateral feet her PCP advised her to discuss this with our office.  Patient also feels as if she has restless leg also discussed with her PCP and advised her to speak with our office.  Having leg jerking, jumping at night time while waking patient up.  Please call

## 2016-07-04 NOTE — Telephone Encounter (Signed)
Dr Anne Hahn- do you know if patient still wants to proceed with botox?

## 2016-07-04 NOTE — Telephone Encounter (Signed)
I would continue the injections for now, the patient has not indicated to me that she desire to stop treatments.

## 2016-07-04 NOTE — Telephone Encounter (Signed)
Did she still want to proceed with injections?

## 2016-07-05 ENCOUNTER — Encounter (HOSPITAL_COMMUNITY): Payer: Self-pay

## 2016-07-05 ENCOUNTER — Encounter (HOSPITAL_COMMUNITY)
Admission: RE | Admit: 2016-07-05 | Discharge: 2016-07-05 | Disposition: A | Discharge status: Home / Self Care | Payer: Medicaid Other | Source: Ambulatory Visit | Attending: Neurology | Admitting: Neurology

## 2016-07-05 DIAGNOSIS — G35 Multiple sclerosis: Secondary | ICD-10-CM | POA: Insufficient documentation

## 2016-07-05 MED ORDER — SODIUM CHLORIDE 0.9 % IV SOLN
INTRAVENOUS | Status: DC
  Administered 2016-07-05: 09:00:00 via INTRAVENOUS

## 2016-07-05 MED ORDER — LORATADINE 10 MG PO TABS
10.0000 mg | ORAL_TABLET | ORAL | Status: DC
  Administered 2016-07-05: 10 mg via ORAL
  Filled 2016-07-05: qty 1

## 2016-07-05 MED ORDER — ACETAMINOPHEN 325 MG PO TABS
650.0000 mg | ORAL_TABLET | ORAL | Status: DC
  Administered 2016-07-05: 650 mg via ORAL
  Filled 2016-07-05: qty 2

## 2016-07-05 MED ORDER — SODIUM CHLORIDE 0.9 % IV SOLN
300.0000 mg | INTRAVENOUS | Status: DC
  Administered 2016-07-05: 300 mg via INTRAVENOUS
  Filled 2016-07-05: qty 15

## 2016-07-05 NOTE — Discharge Instructions (Signed)
Natalizumab injection / Infusion , Tysabri  What is this medicine? NATALIZUMAB (na ta LIZ you mab) is used to treat relapsing multiple sclerosis. This drug is not a cure. It is also used to treat Crohn's disease. This medicine may be used for other purposes; ask your health care provider or pharmacist if you have questions. COMMON BRAND NAME(S): Tysabri What should I tell my health care provider before I take this medicine? They need to know if you have any of these conditions: -immune system problems -progressive multifocal leukoencephalopathy (PML) -an unusual or allergic reaction to natalizumab, other medicines, foods, dyes, or preservatives -pregnant or trying to get pregnant -breast-feeding How should I use this medicine? This medicine is for infusion into a vein. It is given by a health care professional in a hospital or clinic setting. A special MedGuide will be given to you by the pharmacist with each prescription and refill. Be sure to read this information carefully each time. Talk to your pediatrician regarding the use of this medicine in children. This medicine is not approved for use in children. Overdosage: If you think you have taken too much of this medicine contact a poison control center or emergency room at once. NOTE: This medicine is only for you. Do not share this medicine with others. What if I miss a dose? It is important not to miss your dose. Call your doctor or health care professional if you are unable to keep an appointment. What may interact with this medicine? -azathioprine -cyclosporine -interferon -6-mercaptopurine -methotrexate -steroid medicines like prednisone or cortisone -TNF-alpha inhibitors like adalimumab, etanercept, and infliximab -vaccines This list may not describe all possible interactions. Give your health care provider a list of all the medicines, herbs, non-prescription drugs, or dietary supplements you use. Also tell them if you smoke,  drink alcohol, or use illegal drugs. Some items may interact with your medicine. What should I watch for while using this medicine? Your condition will be monitored carefully while you are receiving this medicine. Visit your doctor for regular check ups. Tell your doctor or healthcare professional if your symptoms do not start to get better or if they get worse. Stay away from people who are sick. Call your doctor or health care professional for advice if you get a fever, chills or sore throat, or other symptoms of a cold or flu. Do not treat yourself. In some patients, this medicine may cause a serious brain infection that may cause death. If you have any problems seeing, thinking, speaking, walking, or standing, tell your doctor right away. If you cannot reach your doctor, get urgent medical care. What side effects may I notice from receiving this medicine? Side effects that you should report to your doctor or health care professional as soon as possible: -allergic reactions like skin rash, itching or hives, swelling of the face, lips, or tongue -breathing problems -changes in vision -chest pain -dark urine -depression, feelings of sadness -dizziness -general ill feeling or flu-like symptoms -irregular, missed, or painful menstrual periods -light-colored stools -loss of appetite, nausea -muscle weakness -problems with balance, talking, or walking -right upper belly pain -unusually weak or tired -yellowing of the eyes or skin Side effects that usually do not require medical attention (report to your doctor or health care professional if they continue or are bothersome): -aches, pains -headache -stomach upset -tiredness This list may not describe all possible side effects. Call your doctor for medical advice about side effects. You may report side effects to FDA at  1-800-FDA-1088. Where should I keep my medicine? This drug is given in a hospital or clinic and will not be stored at  home. NOTE: This sheet is a summary. It may not cover all possible information. If you have questions about this medicine, talk to your doctor, pharmacist, or health care provider.  2017 Elsevier/Gold Standard (2008-07-03 13:33:21)

## 2016-07-05 NOTE — Progress Notes (Signed)
Tolerated infusion well and refused to stay post infusion assessment time.

## 2016-07-05 NOTE — Telephone Encounter (Signed)
I called patient to discuss, she did not answer so I left a VM asking her to return my call.

## 2016-07-06 ENCOUNTER — Encounter: Payer: Self-pay | Admitting: Nurse Practitioner

## 2016-07-06 ENCOUNTER — Ambulatory Visit (INDEPENDENT_AMBULATORY_CARE_PROVIDER_SITE_OTHER): Payer: Medicaid Other | Admitting: Nurse Practitioner

## 2016-07-06 VITALS — BP 116/72 | HR 86 | Ht 62.0 in | Wt 240.0 lb

## 2016-07-06 DIAGNOSIS — R112 Nausea with vomiting, unspecified: Secondary | ICD-10-CM

## 2016-07-06 DIAGNOSIS — K219 Gastro-esophageal reflux disease without esophagitis: Secondary | ICD-10-CM | POA: Diagnosis not present

## 2016-07-06 DIAGNOSIS — R1030 Lower abdominal pain, unspecified: Secondary | ICD-10-CM | POA: Diagnosis not present

## 2016-07-06 DIAGNOSIS — R142 Eructation: Secondary | ICD-10-CM

## 2016-07-06 NOTE — Patient Instructions (Signed)
If you are age 29 or older, your body mass index should be between 23-30. Your Body mass index is 43.9 kg/m. If this is out of the aforementioned range listed, please consider follow up with your Primary Care Provider.  If you are age 76 or younger, your body mass index should be between 19-25. Your Body mass index is 43.9 kg/m. If this is out of the aformentioned range listed, please consider follow up with your Primary Care Provider.   Please start taking Dexilant as instructed.  You have been given a hand out on reflux.  Please discontinue carbonated beverages, artificial sweeteners and Ibuprofen.  You have been scheduled for an endoscopy. Please follow written instructions given to you at your visit today. If you use inhalers (even only as needed), please bring them with you on the day of your procedure. Your physician has requested that you go to www.startemmi.com and enter the access code given to you at your visit today. This web site gives a general overview about your procedure. However, you should still follow specific instructions given to you by our office regarding your preparation for the procedure.  Thank you.

## 2016-07-07 ENCOUNTER — Emergency Department
Admission: EM | Admit: 2016-07-07 | Discharge: 2016-07-07 | Disposition: A | Discharge status: Home / Self Care | Payer: Medicaid Other | Attending: Emergency Medicine | Admitting: Emergency Medicine

## 2016-07-07 DIAGNOSIS — R42 Dizziness and giddiness: Secondary | ICD-10-CM | POA: Diagnosis present

## 2016-07-07 DIAGNOSIS — R51 Headache: Secondary | ICD-10-CM | POA: Insufficient documentation

## 2016-07-07 DIAGNOSIS — Z791 Long term (current) use of non-steroidal anti-inflammatories (NSAID): Secondary | ICD-10-CM | POA: Insufficient documentation

## 2016-07-07 DIAGNOSIS — Z79899 Other long term (current) drug therapy: Secondary | ICD-10-CM | POA: Insufficient documentation

## 2016-07-07 DIAGNOSIS — R519 Headache, unspecified: Secondary | ICD-10-CM

## 2016-07-07 LAB — BASIC METABOLIC PANEL
ANION GAP: 7 (ref 5–15)
BUN: 13 mg/dL (ref 6–20)
CALCIUM: 9.6 mg/dL (ref 8.9–10.3)
CO2: 30 mmol/L (ref 22–32)
Chloride: 100 mmol/L — ABNORMAL LOW (ref 101–111)
Creatinine, Ser: 0.73 mg/dL (ref 0.44–1.00)
GFR calc Af Amer: >60 mL/min (ref >60)
Glucose, Bld: 96 mg/dL (ref 65–99)
POTASSIUM: 3.3 mmol/L — AB (ref 3.5–5.1)
Sodium: 137 mmol/L (ref 135–145)

## 2016-07-07 LAB — CBC
HEMATOCRIT: 38 % (ref 35.0–47.0)
HEMOGLOBIN: 12.9 g/dL (ref 12.0–16.0)
MCH: 29.6 pg (ref 26.0–34.0)
MCHC: 33.9 g/dL (ref 32.0–36.0)
MCV: 87.2 fL (ref 80.0–100.0)
Platelets: 257 10*3/uL (ref 150–440)
RBC: 4.36 MIL/uL (ref 3.80–5.20)
RDW: 16.4 % — AB (ref 11.5–14.5)
WBC: 8.8 10*3/uL (ref 3.6–11.0)

## 2016-07-07 LAB — URINALYSIS, COMPLETE (UACMP) WITH MICROSCOPIC
Bilirubin Urine: NEGATIVE
GLUCOSE, UA: NEGATIVE mg/dL
HGB URINE DIPSTICK: NEGATIVE
Ketones, ur: NEGATIVE mg/dL
LEUKOCYTES UA: NEGATIVE
NITRITE: NEGATIVE
PROTEIN: NEGATIVE mg/dL
RBC / HPF: NONE SEEN RBC/hpf (ref 0–5)
Specific Gravity, Urine: 1.017 (ref 1.005–1.030)
WBC UA: NONE SEEN WBC/hpf (ref 0–5)
pH: 7 (ref 5.0–8.0)

## 2016-07-07 LAB — URINE DRUG SCREEN, QUALITATIVE (ARMC ONLY)
AMPHETAMINES, UR SCREEN: NOT DETECTED
Barbiturates, Ur Screen: NOT DETECTED
Benzodiazepine, Ur Scrn: NOT DETECTED
CANNABINOID 50 NG, UR ~~LOC~~: NOT DETECTED
Cocaine Metabolite,Ur ~~LOC~~: NOT DETECTED
MDMA (ECSTASY) UR SCREEN: NOT DETECTED
Methadone Scn, Ur: NOT DETECTED
OPIATE, UR SCREEN: NOT DETECTED
PHENCYCLIDINE (PCP) UR S: NOT DETECTED
Tricyclic, Ur Screen: NOT DETECTED

## 2016-07-07 LAB — POCT PREGNANCY, URINE: PREG TEST UR: NEGATIVE

## 2016-07-07 MED ORDER — SODIUM CHLORIDE 0.9 % IV BOLUS (SEPSIS)
1000.0000 mL | Freq: Once | INTRAVENOUS | Status: AC
  Administered 2016-07-07: 1000 mL via INTRAVENOUS

## 2016-07-07 MED ORDER — KETOROLAC TROMETHAMINE 30 MG/ML IJ SOLN
15.0000 mg | Freq: Once | INTRAMUSCULAR | Status: AC
  Administered 2016-07-07: 15 mg via INTRAVENOUS

## 2016-07-07 MED ORDER — KETOROLAC TROMETHAMINE 30 MG/ML IJ SOLN
INTRAMUSCULAR | Status: AC
  Administered 2016-07-07: 15 mg via INTRAVENOUS
  Filled 2016-07-07: qty 1

## 2016-07-07 NOTE — ED Provider Notes (Addendum)
[facility] Emergency Department Provider Note  ____________________________________________   I have reviewed the triage vital signs and the nursing notes.   HISTORY  Chief Complaint Dizziness    HPI Frances Dudley is a 31 y.o. female with a history of fibromyalgia, MS, chronic headaches, multiple allergies, there is chronic pain complaints, depression, who presents today stating that she feels lightheaded for 2 weeks. She seen her neurologist for this. They gave her steroid taper which didn't help. The steroid taper ended on Thursday, but she did not call her doctor and elected instead to come to the emergency department on Saturday night to have further assessment of this chronic issue. Patient also has had a headache and appears since sometime in mid to early January. This is her routine headache. She has had Botox injections but it doesn't seem to help.She is being scheduled for an outpatient endoscopy for belching and early satiety. That constellation of problems persist as well. She denies any focal neurologic deficits fever chills or stiff neck. Review of notes suggest patient also has been recently in the last week or so complaining to her physicians, in addition to the burping and early satiety vomiting and headache, of about burning and stinging in her feet, restless leg syndrome, and other complaints. Patient has had her Lyrica increase and it doesn't seem to be helped her symptoms.       Past Medical History:  Diagnosis Date  . Chronic migraine without aura or status migrainosus 04/12/2014  . Depression   . Fall   . Fibromyalgia 04/12/2014  . Migraine   . Multiple sclerosis (HCC)   . Pseudotumor cerebri 03/23/2016  . TB lung, latent     Patient Active Problem List   Diagnosis Date Noted  . Gastroesophageal reflux disease without esophagitis 06/28/2016  . Circadian rhythm sleep disorder, delayed sleep phase type 04/23/2016  . Hypersomnia,  idiopathic 04/23/2016  . Insomnia, psychophysiological 04/23/2016  . Pseudotumor cerebri 03/23/2016  . Irregular menstrual cycle 04/28/2015  . Right ovarian cyst 04/28/2015  . Breast pain 04/27/2015  . Chronic back pain 04/27/2015  . Intractable chronic migraine without aura 09/07/2014  . Pendulous breast 07/30/2014  . Obesity, morbid (HCC) 07/29/2014  . Depression 05/03/2014  . Skin lesion of face 05/03/2014  . BMI 38.0-38.9,adult 05/03/2014  . Fibromyalgia 04/12/2014  . Chronic migraine without aura or status migrainosus 04/12/2014  . Environmental allergies 04/06/2014  . Multiple sclerosis (HCC) 04/06/2014  . Faintness 04/06/2014  . Vertigo 04/06/2014  . Dizziness 04/06/2014  . Nausea without vomiting 04/06/2014  . History of migraine headaches 04/06/2014  . Generalized pain 04/06/2014  . TB lung, latent 04/06/2014    Past Surgical History:  Procedure Laterality Date  . NONE      Prior to Admission medications   Medication Sig Start Date End Date Taking? Authorizing Provider  amphetamine-dextroamphetamine (ADDERALL) 10 MG tablet Take 1 tablet (10 mg total) by mouth 2 (two) times daily with a meal. 05/03/16   Porfirio Mylar Dohmeier, MD  Chlorphen-PE-Acetaminophen (NOREL AD) 4-10-325 MG TABS Take 1 tablet by mouth every 6 (six) hours as needed. 05/09/16   Massie Maroon, FNP  Cholecalciferol (VITAMIN D) 2000 UNITS CAPS Take 6,000 Units by mouth daily.     Historical Provider, MD  citalopram (CELEXA) 40 MG tablet Take 1 tablet (40 mg total) by mouth daily. 08/25/15   York Spaniel, MD  fluticasone (FLONASE) 50 MCG/ACT nasal spray Place 2 sprays into both nostrils daily. X 14 days  06/22/14   Mathis Fare Presson, PA  furosemide (LASIX) 20 MG tablet Take 1 tablet (20 mg total) by mouth daily. 03/28/16   York Spaniel, MD  ibuprofen (ADVIL,MOTRIN) 800 MG tablet Take 800 mg by mouth 2 (two) times daily.    Historical Provider, MD  loratadine (CLARITIN) 10 MG tablet Take 1 tablet  (10 mg total) by mouth daily. 04/06/14   Massie Maroon, FNP  Melatonin 5 MG TABS Take by mouth.    Historical Provider, MD  natalizumab (TYSABRI) 300 MG/15ML injection Inject into the vein. Every 28 days    Historical Provider, MD  potassium chloride (K-DUR) 10 MEQ tablet Take 2 tablets (20 mEq total) by mouth daily. 03/28/16   York Spaniel, MD  pregabalin (LYRICA) 100 MG capsule Take 1 capsule (100 mg total) by mouth 2 (two) times daily. 06/20/16   York Spaniel, MD  pregabalin (LYRICA) 100 MG capsule Take 1 capsule (100 mg total) by mouth 2 (two) times daily. 06/20/16   York Spaniel, MD  pregabalin (LYRICA) 50 MG capsule Take 1 capsule (50 mg total) by mouth 2 (two) times daily. 07/03/16   York Spaniel, MD  pyridOXINE (VITAMIN B-6) 100 MG tablet Take 100 mg by mouth at bedtime.     Historical Provider, MD  QUEtiapine (SEROQUEL) 100 MG tablet Take 1 tablet (100 mg total) by mouth at bedtime. 02/03/16   York Spaniel, MD    Allergies Amitriptyline; Baclofen; Chlorhexidine gluconate; Diamox [acetazolamide]; Diazepam; Sumatriptan; and Zonegran [zonisamide]  Family History  Problem Relation Age of Onset  . Adopted: Yes  . Fibromyalgia Mother     Social History Social History  Substance Use Topics  . Smoking status: Never Smoker  . Smokeless tobacco: Never Used  . Alcohol use No     Comment: occ    Review of Systems Constitutional: No fever/chills Eyes: No visual changes. ENT: No sore throat. No stiff neck no neck pain Cardiovascular: Denies chest pain. Respiratory: Denies shortness of breath. Gastrointestinal:   no vomiting.  No diarrhea.  No constipation. Genitourinary: Negative for dysuria. Musculoskeletal: Negative lower extremity swelling Skin: Negative for rash. Neurological: Negative for severe headaches, focal weakness or numbness. 10-point ROS otherwise negative.  ____________________________________________   PHYSICAL EXAM:  VITAL SIGNS: ED Triage  Vitals  Enc Vitals Group     BP 07/07/16 2048 106/75     Pulse Rate 07/07/16 2048 94     Resp 07/07/16 2048 16     Temp 07/07/16 2048 98.9 F (37.2 C)     Temp Source 07/07/16 2048 Oral     SpO2 07/07/16 2048 100 %     Weight 07/07/16 2049 240 lb (108.9 kg)     Height 07/07/16 2049 5\' 2"  (1.575 m)     Head Circumference --      Peak Flow --      Pain Score 07/07/16 2049 0     Pain Loc --      Pain Edu? --      Excl. in GC? --     Constitutional: Alert and oriented. Well appearing and in no acute distress.No discernible evidence of external sternal disease manifestations noted patient comfortable sitting in the room with no evidence of discomfort or illness. Eyes: Conjunctivae are normal. PERRL. EOMI. Head: Atraumatic. Nose: No congestion/rhinnorhea. Mouth/Throat: Mucous membranes are moist.  Oropharynx non-erythematous. Neck: No stridor.   Nontender with no meningismus Cardiovascular: Normal rate, regular rhythm. Grossly normal heart sounds.  Good peripheral circulation. Respiratory: Normal respiratory effort.  No retractions. Lungs CTAB. Abdominal: Soft and nontender. No distention. No guarding no rebound morbid obesity limits exam Back:  There is no focal tenderness or step off.  there is no midline tenderness there are no lesions noted. there is no CVA tenderness Musculoskeletal: No lower extremity tenderness, no upper extremity tenderness. No joint effusions, no DVT signs strong distal pulses no edema Neurologic:  Cranial nerves II through XII are grossly intact 5 out of 5 strength bilateral upper and lower extremity. Finger to nose within normal limits heel to shin within normal limits, speech is normal with no word finding difficulty or dysarthria, reflexes symmetric, pupils are equally round and reactive to light, there is no pronator drift, sensation is normal, vision is intact to confrontation, gait is deferred, there is no nystagmus, normal neurologic exam Skin:  Skin is  warm, dry and intact. No rash noted. Psychiatric: Mood and affect are normal. Speech and behavior are normal.  ____________________________________________   LABS (all labs ordered are listed, but only abnormal results are displayed)  Labs Reviewed  BASIC METABOLIC PANEL - Abnormal; Notable for the following:       Result Value   Potassium 3.3 (*)    Chloride 100 (*)    All other components within normal limits  CBC - Abnormal; Notable for the following:    RDW 16.4 (*)    All other components within normal limits  URINALYSIS, COMPLETE (UACMP) WITH MICROSCOPIC  URINE DRUG SCREEN, QUALITATIVE ([facility] ONLY)  POC URINE PREG, ED   ____________________________________________  EKG  I personally interpreted any EKGs ordered by me or triage Normal sinus rhythm at 90 bpm no acute ST elevation or acute ST depression nonspecific ST changes ____________________________________________  RADIOLOGY  I reviewed any imaging ordered by me or triage that were performed during my shift and, if possible, patient and/or family made aware of any abnormal findings. ____________________________________________   PROCEDURES  Procedure(s) performed: None  Procedures  Critical Care performed: None  ____________________________________________   INITIAL IMPRESSION / ASSESSMENT AND PLAN / ED COURSE  Pertinent labs & imaging results that were available during my care of the patient were reviewed by me and considered in my medical decision making (see chart for details).  Patient here with chronic headaches and lightheadedness, reassuring neurologic exam nothing to suggest meningitis head bleed CVA significant MS flare or other acute pathology now requiring imaging or admission at this time. She is neurologically intact. Most of these complaints are very chronic. Psych exam what brought her in today. Is also unclear why she didn't talk to her neurologist in the 2 days during the week since her  steroids ever went away when she currently was also having the symptoms. We'll give her IV fluid as she states she is dehydrated although patient is in no acute distress and certainly doesn't appear dehydrated. Vital signs are normal here we'll check basic blood work to ensure no other pathology is noted. I would not be surprised if she had a elevated white count from her steroids. We'll check urine pregnancy although she denies pregnancy was checked urinalysis we will give her some day for her headache and I think hopefully be able to safely get her home if nothing else appears.   ----------------------------------------- 10:07 PM on 07/07/2016 -----------------------------------------  Workup reassuring, vital signs reassuring, patient seen eat a candy bar in the department and is playing on her cell phone which I take to be good prognostic  indicators of safe discharge if her results continue to come back normal. I'm also encouraged by the fact the patient weighed 241 pounds in January, and still is listed at 240 today despite her chronic vomiting syndrome she has not actually lost weight which I think is a reassuring fact.  ----------------------------------------- 10:46 PM on 07/07/2016 -----------------------------------------  Patient is in no acute distress playing on her cell phone, she states she feels a little bit better. There is no discernible acute pathology. This could be related to her MS but I don't think he requires admission or emergent imaging. We will discharge her to for close outpatient follow-up with her primary care doctor. Patient tolerating by mouth in no acute distress with normal neurologic exam on discharge   ____________________________________________   FINAL CLINICAL IMPRESSION(S) / ED DIAGNOSES  Final diagnoses:  None      This chart was dictated using voice recognition software.  Despite best efforts to proofread,  errors can occur which can change meaning.       Jeanmarie Plant, MD 07/07/16 2136    Jeanmarie Plant, MD 07/07/16 1610    Jeanmarie Plant, MD 07/07/16 [phone]

## 2016-07-07 NOTE — ED Notes (Signed)
Unsuccessful IV start x2 (LACx1, LWx1), asked Elpidio Galea to try.

## 2016-07-07 NOTE — ED Triage Notes (Signed)
Pt states dizziness for two weeks that has progressively gotten worse "this last couple of days." pt states dizziness is "all the time". Pt denies fever. Pt states she has had nausea and vomiting with dizziness. Skin pwd, resps unlabored. perrl 3mm and brisk.

## 2016-07-07 NOTE — ED Notes (Signed)
ED Provider at bedside. 

## 2016-07-08 NOTE — Progress Notes (Signed)
HPI: Patient is a 31 yo female with history of multiple sclerosis, migraines, fibromyalgia, and depression.  followed by [facility]. She is referred by PCP Julianne Handler, FNP for GERD, nausea and vomiting. Patient is accompanied by her mother. She gives 6 month history of excessive belching and almost daily vomiting.  Emesis sometimes consists of undigested food but mainly just liquids. Vomiting random, not just related to meals  She can't correlate onset of vomiting time wise to any of her medications. She does take NSAIDs on a daily basis. Despite months of vomiting there hasn't been any weight loss. She began having reflux / heartburn a couple of months ago . Symptoms are not particularly worse at night . She drinks carbonated beverages , eats chocolate on a regular basis. Has tried Protonix without improvement. Recently prescribed Dexilant, awaiting pharmacy to get the medication in.    Past Medical History:  Diagnosis Date  . Chronic migraine without aura or status migrainosus 04/12/2014  . Depression   . Fall   . Fibromyalgia 04/12/2014  . Migraine   . Multiple sclerosis (HCC)   . Pseudotumor cerebri 03/23/2016  . TB lung, latent      Past Surgical History:  Procedure Laterality Date  . NONE     Family History  Problem Relation Age of Onset  . Adopted: Yes  . Fibromyalgia Mother    Social History  Substance Use Topics  . Smoking status: Never Smoker  . Smokeless tobacco: Never Used  . Alcohol use No     Comment: occ   Current Outpatient Prescriptions  Medication Sig Dispense Refill  . amphetamine-dextroamphetamine (ADDERALL) 10 MG tablet Take 1 tablet (10 mg total) by mouth 2 (two) times daily with a meal. 60 tablet 0  . Chlorphen-PE-Acetaminophen (NOREL AD) 4-10-325 MG TABS Take 1 tablet by mouth every 6 (six) hours as needed. 20 tablet 0  . Cholecalciferol (VITAMIN D) 2000 UNITS CAPS Take 6,000 Units by mouth daily.     . citalopram (CELEXA) 40 MG  tablet Take 1 tablet (40 mg total) by mouth daily. 90 tablet 3  . fluticasone (FLONASE) 50 MCG/ACT nasal spray Place 2 sprays into both nostrils daily. X 14 days 16 g 0  . furosemide (LASIX) 20 MG tablet Take 1 tablet (20 mg total) by mouth daily. 30 tablet 3  . ibuprofen (ADVIL,MOTRIN) 800 MG tablet Take 800 mg by mouth 2 (two) times daily.    Marland Kitchen loratadine (CLARITIN) 10 MG tablet Take 1 tablet (10 mg total) by mouth daily. 30 tablet 2  . Melatonin 5 MG TABS Take by mouth.    . natalizumab (TYSABRI) 300 MG/15ML injection Inject into the vein. Every 28 days    . potassium chloride (K-DUR) 10 MEQ tablet Take 2 tablets (20 mEq total) by mouth daily. 60 tablet 3  . pregabalin (LYRICA) 100 MG capsule Take 1 capsule (100 mg total) by mouth 2 (two) times daily. 180 capsule 2  . pregabalin (LYRICA) 100 MG capsule Take 1 capsule (100 mg total) by mouth 2 (two) times daily. 180 capsule 2  . pregabalin (LYRICA) 50 MG capsule Take 1 capsule (50 mg total) by mouth 2 (two) times daily. 60 capsule 3  . pyridOXINE (VITAMIN B-6) 100 MG tablet Take 100 mg by mouth at bedtime.     Marland Kitchen QUEtiapine (SEROQUEL) 100 MG tablet Take 1 tablet (100 mg total) by mouth at bedtime. 30 tablet 5   Current Facility-Administered Medications  Medication Dose  Route Frequency Provider Last Rate Last Dose  . botulinum toxin Type A (BOTOX) injection 200 Units  200 Units Intramuscular Once York Spaniel, MD       Allergies  Allergen Reactions  . Amitriptyline   . Baclofen     Headache, nausea  . Chlorhexidine Gluconate Itching     CHG wipe causes skin to be red and itchy - use alcohol when starting iv site   . Diamox [Acetazolamide]     Weakness, stomach upset  . Diazepam Nausea Only  . Sumatriptan Nausea And Vomiting and Other (See Comments)    lockjaw  . Zonegran [Zonisamide]     Intolerance      Review of Systems: All systems reviewed and negative except where noted in HPI.    Physical Exam: BP 116/72   Pulse 86    Ht 5\' 2"  (1.575 m)   Wt 240 lb (108.9 kg)   LMP 06/08/2016   BMI 43.90 kg/m  Constitutional:  Obese black female in no acute distress. Psychiatric: Normal mood and affect. Behavior is normal. HEENT: Normocephalic and atraumatic. Conjunctivae are normal. No scleral icterus. Neck supple.  Cardiovascular: Normal rate, regular rhythm.  Pulmonary/chest: Effort normal and breath sounds normal. No wheezing, rales or rhonchi. Abdominal: Soft, nondistended, nontender. Bowel sounds active throughout. There are no masses palpable. No hepatomegaly. Extremities: no edema Lymphadenopathy: No cervical adenopathy noted. Neurological: Alert and oriented to person place and time. Skin: Skin is warm and dry. No rashes noted.   ASSESSMENT AND PLAN:  1. 31 yo female with several month history of nausea / vomiting. She takes a lot of NSAIDS. Certainly PUD possible vrs gastroparesis from medications vrs functional. Additionally she has MS and chronic migraines, MRI last fall raised question of increased intracranial HTN but this is being treated by Neurology and vomiting doesn't occur with her headaches.  -for further evaluation of nausea and vomiting patient will be scheduled for upper endoscopy. The risks and benefits of EGD were discussed and the patient agrees to proceed.  -Recommend she discontinue ibuprofen for now at least until vomiting can be sorted out.   2. GERD. She is awaiting prescription of Dexilant from her pharmacy.  -Start Dexilant  -Discontinue carbonated beverages -Antireflux measures discussed, GERD literature given   [name], NP  07/08/2016, 11:30 PM  Cc: Massie Maroon, FNP

## 2016-07-09 ENCOUNTER — Telehealth: Payer: Self-pay | Admitting: Neurology

## 2016-07-09 ENCOUNTER — Other Ambulatory Visit: Payer: Self-pay | Admitting: Family Medicine

## 2016-07-09 DIAGNOSIS — K219 Gastro-esophageal reflux disease without esophagitis: Secondary | ICD-10-CM

## 2016-07-09 NOTE — Telephone Encounter (Signed)
Dr Willis- please advise 

## 2016-07-09 NOTE — Telephone Encounter (Signed)
I called the patient. The patient continues to have severe headaches, she is having lightheaded sensations, not true vertigo. She went to the emergency room on February 10, no treatment was offered.  She has noted that naproxen works very well for her headaches, she has been told not to take nonsteroidal anti-inflammatory medications however as her gastroenterologist believes that she may have esophageal irritation, possible ulcer. They're undergoing a workup currently.  We will get the patient in for a Depacon and Toradol injection tomorrow.

## 2016-07-09 NOTE — Telephone Encounter (Signed)
Patient called states she was in the ER on 07/07/16 and would like to discuss this visit with Dr. Anne Hahn.  Please call

## 2016-07-09 NOTE — Progress Notes (Signed)
Consult note reviewed. Agree with ruling out GI cause for nausea and vomiting including PPI, endoscopy, and gastric emptying study. If all of this is unremarkable and no response to PPI, then back to neurology to rule out central cause for her symptoms given her significant neurologic history

## 2016-07-10 ENCOUNTER — Telehealth: Payer: Self-pay

## 2016-07-10 NOTE — Telephone Encounter (Signed)
Patient is asking if we can send her to a multiple sclerosis specialists? She says she has found one in [facility]. Please advise if we can send in this referral for her. Phone #[phone]. Fax#[phone]

## 2016-07-11 NOTE — Telephone Encounter (Signed)
Called and left message advising patient that she needs to follow up with Neurology on being referred to a MS specialists.

## 2016-07-11 NOTE — Telephone Encounter (Signed)
-----   Message from Massie Maroon, [place] sent at 07/11/2016  9:09 AM EST ----- Please inquire whether Neurology is suggesting an MS specialist. Please have her follow up with neurology.

## 2016-07-12 ENCOUNTER — Other Ambulatory Visit: Payer: Self-pay

## 2016-07-12 ENCOUNTER — Encounter: Payer: Self-pay | Admitting: Internal Medicine

## 2016-07-12 ENCOUNTER — Ambulatory Visit (AMBULATORY_SURGERY_CENTER): Payer: Medicaid Other | Admitting: Internal Medicine

## 2016-07-12 VITALS — BP 116/69 | HR 76 | Temp 97.7°F | Resp 17 | Ht 62.0 in | Wt 240.0 lb

## 2016-07-12 DIAGNOSIS — R112 Nausea with vomiting, unspecified: Secondary | ICD-10-CM | POA: Diagnosis not present

## 2016-07-12 DIAGNOSIS — K219 Gastro-esophageal reflux disease without esophagitis: Secondary | ICD-10-CM

## 2016-07-12 DIAGNOSIS — R1084 Generalized abdominal pain: Secondary | ICD-10-CM

## 2016-07-12 MED ORDER — SODIUM CHLORIDE 0.9 % IV SOLN: 500.0000 mL | INTRAVENOUS | Status: AC

## 2016-07-12 NOTE — Op Note (Signed)
[facility] Patient Name: Frances Dudley Procedure Date: 07/12/2016 10:05 AM MRN: [id number] Endoscopist: Wilhemina Bonito. Marina Goodell , MD Age: 31 Referring MD:  Date of Birth: 19-Aug-1985 Gender: Female Account #: [id number] Procedure:                Upper GI endoscopy Indications:              Abdominal pain, Suspected esophageal reflux, Nausea                            with vomiting Medicines:                Monitored Anesthesia Care Procedure:                Pre-Anesthesia Assessment:                           - Prior to the procedure, a History and Physical                            was performed, and patient medications and                            allergies were reviewed. The patient's tolerance of                            previous anesthesia was also reviewed. The risks                            and benefits of the procedure and the sedation                            options and risks were discussed with the patient.                            All questions were answered, and informed consent                            was obtained. Prior Anticoagulants: The patient has                            taken no previous anticoagulant or antiplatelet                            agents. ASA Grade Assessment: II - A patient with                            mild systemic disease. After reviewing the risks                            and benefits, the patient was deemed in                            satisfactory condition to undergo the procedure.  After obtaining informed consent, the endoscope was                            passed under direct vision. Throughout the                            procedure, the patient's blood pressure, pulse, and                            oxygen saturations were monitored continuously. The                            Model GIF-[id number]) scope was introduced                            through the mouth, and advanced to  the second part                            of duodenum. The upper GI endoscopy was                            accomplished without difficulty. The patient                            tolerated the procedure well. Scope In: Scope Out: Findings:                 The esophagus was normal.                           The stomach was normal.                           The examined duodenum was normal.                           The cardia and gastric fundus were normal on                            retroflexion. Complications:            No immediate complications. Estimated Blood Loss:     Estimated blood loss: none. Impression:               - Normal EGD                           - GERD. Recommendation:           1. Reflux precautions                           2. Weight loss                           3. New medication for acid reflux                           4. Schedule solid-phase gastric emptying  scan (rule                            out gastroparesis). We will contact you with the                            results                           5. Resume general medical care with your primary                            provider and specialists. Wilhemina Bonito. Marina Goodell, MD 07/12/2016 10:37:23 AM This report has been signed electronically.

## 2016-07-12 NOTE — Patient Instructions (Signed)
YOU HAD AN ENDOSCOPIC PROCEDURE TODAY AT THE [facility]:   Refer to the procedure report that was given to you for any specific questions about what was found during the examination.  If the procedure report does not answer your questions, please call your gastroenterologist to clarify.  If you requested that your care partner not be given the details of your procedure findings, then the procedure report has been included in a sealed envelope for you to review at your convenience later.  YOU SHOULD EXPECT: Some feelings of bloating in the abdomen. Passage of more gas than usual.  Walking can help get rid of the air that was put into your GI tract during the procedure and reduce the bloating. If you had a lower endoscopy (such as a colonoscopy or flexible sigmoidoscopy) you may notice spotting of blood in your stool or on the toilet paper. If you underwent a bowel prep for your procedure, you may not have a normal bowel movement for a few days.  Please Note:  You might notice some irritation and congestion in your nose or some drainage.  This is from the oxygen used during your procedure.  There is no need for concern and it should clear up in a day or so.  SYMPTOMS TO REPORT IMMEDIATELY:   Following upper endoscopy (EGD)  Vomiting of blood or coffee ground material  New chest pain or pain under the shoulder blades  Painful or persistently difficult swallowing  New shortness of breath  Fever of 100F or higher  Black, tarry-looking stools  For urgent or emergent issues, a gastroenterologist can be reached at any hour by calling [phone].   DIET:  We do recommend a small meal at first, but then you may proceed to your regular diet.  Drink plenty of fluids but you should avoid alcoholic beverages for 24 hours.  MEDICATIONS:  Be sure to start the new medication that has already been prescribed.   ACTIVITY:  You should plan to take it easy for the rest of today and you should  NOT DRIVE or use heavy machinery until tomorrow (because of the sedation medicines used during the test).    FOLLOW UP: Our staff will call the number listed on your records the next business day following your procedure to check on you and address any questions or concerns that you may have regarding the information given to you following your procedure. If we do not reach you, we will leave a message.  However, if you are feeling well and you are not experiencing any problems, there is no need to return our call.  We will assume that you have returned to your regular daily activities without incident.  If any biopsies were taken you will be contacted by phone or by letter within the next 1-3 weeks.  Please call us at [phone] if you have not heard about the biopsies in 3 weeks.   Thank you for allowing [place] to provide for your healthcare needs today.   SIGNATURES/CONFIDENTIALITY: You and/or your care partner have signed paperwork which will be entered into your electronic medical record.  These signatures attest to the fact that that the information above on your After Visit Summary has been reviewed and is understood.  Full responsibility of the confidentiality of this discharge information lies with you and/or your care-partner.

## 2016-07-12 NOTE — Progress Notes (Signed)
Report to PACU, RN, vss, BBS= Clear.  

## 2016-07-13 ENCOUNTER — Telehealth: Payer: Self-pay

## 2016-07-13 NOTE — Telephone Encounter (Signed)
  Follow up Call-  Call back number 07/12/2016  Post procedure Call Back phone  # [phone]  Permission to leave phone message Yes     Patient questions:  Do you have a fever, pain , or abdominal swelling? No. Pain Score  0 *  Have you tolerated food without any problems? Yes.    Have you been able to return to your normal activities? Yes.    Do you have any questions about your discharge instructions: Diet   No. Medications  No. Follow up visit  No.  Do you have questions or concerns about your Care? No.  Actions: * If pain score is 4 or above: No action needed, pain <4.

## 2016-07-13 NOTE — Telephone Encounter (Signed)
  Follow up Call-  Call back number 07/12/2016  Post procedure Call Back phone  # [phone]  Permission to leave phone message Yes    Patient was called for follow up after her procedure on 07/12/2016. No answer at the number given for follow up phone call. A message was left on the answering machine.

## 2016-07-16 ENCOUNTER — Telehealth: Payer: Self-pay

## 2016-07-16 NOTE — Telephone Encounter (Signed)
Pt scheduled for GES at [facility] 07/24/16@7 :30am, pt to arrive there at 7:15am and be NPO after midnight. Pt to hold stomach meds for 8 hours prior to test. Pt aware of appt.

## 2016-07-17 ENCOUNTER — Telehealth: Payer: Self-pay | Admitting: Internal Medicine

## 2016-07-17 ENCOUNTER — Telehealth: Payer: Self-pay | Admitting: Neurology

## 2016-07-17 DIAGNOSIS — G35 Multiple sclerosis: Secondary | ICD-10-CM

## 2016-07-17 NOTE — Telephone Encounter (Signed)
Dr Anne Hahn- are you ok with placing referral?

## 2016-07-17 NOTE — Telephone Encounter (Signed)
Patient would like a referral to [facility] [phone].  Patient does not want to leave Dr. Anne Hahn.

## 2016-07-17 NOTE — Telephone Encounter (Signed)
I will make the referral.

## 2016-07-18 MED ORDER — DEXLANSOPRAZOLE 60 MG PO CPDR: 60.0000 mg | DELAYED_RELEASE_CAPSULE | Freq: Every day | ORAL | 3 refills | Status: DC

## 2016-07-18 NOTE — Telephone Encounter (Signed)
Sent rx for Dexilant to pharmacy

## 2016-07-24 ENCOUNTER — Encounter (HOSPITAL_COMMUNITY)
Admission: RE | Admit: 2016-07-24 | Discharge: 2016-07-24 | Disposition: A | Discharge status: Home / Self Care | Payer: Medicaid Other | Source: Ambulatory Visit | Attending: Internal Medicine | Admitting: Internal Medicine

## 2016-07-24 DIAGNOSIS — R1084 Generalized abdominal pain: Secondary | ICD-10-CM | POA: Diagnosis present

## 2016-07-24 MED ORDER — TECHNETIUM TC 99M SULFUR COLLOID
2.2000 | Freq: Once | INTRAVENOUS | Status: AC | PRN
  Administered 2016-07-24: 2.2 via INTRAVENOUS

## 2016-07-26 ENCOUNTER — Telehealth: Payer: Self-pay | Admitting: Neurology

## 2016-07-26 NOTE — Telephone Encounter (Signed)
Dr Anne Hahn- FYI Noted, thank you

## 2016-07-26 NOTE — Telephone Encounter (Signed)
[name] with [facility] is calling to advise the patient's visit has been approved and they will be calling the patient to schedule an appointment. A returned call is not needed.

## 2016-07-28 ENCOUNTER — Other Ambulatory Visit: Payer: Self-pay | Admitting: Neurology

## 2016-07-30 ENCOUNTER — Other Ambulatory Visit: Payer: Self-pay

## 2016-07-30 MED ORDER — DEXLANSOPRAZOLE 60 MG PO CPDR: 60.0000 mg | DELAYED_RELEASE_CAPSULE | Freq: Every day | ORAL | 3 refills | Status: DC

## 2016-07-31 ENCOUNTER — Telehealth: Payer: Self-pay | Admitting: *Deleted

## 2016-07-31 NOTE — Telephone Encounter (Signed)
Called [place] [facility] [phone]. Initiated PA lyrica 50mg  capsule. Gave clinical information. PA approved from 07/31/16-07/26/17. XF#[id number].  Interaction ID#: [id number].   Called [facility] [address]. Spoke with Lorin Picket Advised them PA lyrica approved. He verbalized understanding and will place note on rx.

## 2016-08-02 ENCOUNTER — Ambulatory Visit (INDEPENDENT_AMBULATORY_CARE_PROVIDER_SITE_OTHER): Payer: Medicaid Other | Admitting: Neurology

## 2016-08-02 ENCOUNTER — Telehealth: Payer: Self-pay | Admitting: *Deleted

## 2016-08-02 ENCOUNTER — Encounter: Payer: Self-pay | Admitting: Neurology

## 2016-08-02 VITALS — BP 102/82 | HR 86 | Ht 62.0 in | Wt 240.5 lb

## 2016-08-02 DIAGNOSIS — M797 Fibromyalgia: Secondary | ICD-10-CM

## 2016-08-02 DIAGNOSIS — G43719 Chronic migraine without aura, intractable, without status migrainosus: Secondary | ICD-10-CM

## 2016-08-02 DIAGNOSIS — G932 Benign intracranial hypertension: Secondary | ICD-10-CM | POA: Diagnosis not present

## 2016-08-02 DIAGNOSIS — G35 Multiple sclerosis: Secondary | ICD-10-CM | POA: Diagnosis not present

## 2016-08-02 DIAGNOSIS — Z5181 Encounter for therapeutic drug level monitoring: Secondary | ICD-10-CM

## 2016-08-02 MED ORDER — CITALOPRAM HYDROBROMIDE 40 MG PO TABS: 40.0000 mg | ORAL_TABLET | Freq: Every day | ORAL | 3 refills | Status: DC

## 2016-08-02 MED ORDER — PHENTERMINE HCL 37.5 MG PO CAPS: 37.5000 mg | ORAL_CAPSULE | ORAL | 3 refills | Status: DC

## 2016-08-02 MED ORDER — POTASSIUM CHLORIDE ER 10 MEQ PO TBCR: 40.0000 meq | EXTENDED_RELEASE_TABLET | Freq: Every day | ORAL | 5 refills | Status: DC

## 2016-08-02 MED ORDER — PREGABALIN 150 MG PO CAPS: 150.0000 mg | ORAL_CAPSULE | Freq: Two times a day (BID) | ORAL | 1 refills | Status: DC

## 2016-08-02 MED ORDER — FUROSEMIDE 20 MG PO TABS: 40.0000 mg | ORAL_TABLET | Freq: Every day | ORAL | 5 refills | Status: AC

## 2016-08-02 NOTE — Telephone Encounter (Signed)
Called and spoke with Junious Dresser from [place]. Scheduled routine pick up, lock box. Confirmation number: [id number].

## 2016-08-02 NOTE — Patient Instructions (Signed)
   We will go up on the lasix to 40 mg a day and go up on the potassium to 40 meq a day. We will add Phenteramine for weight loss.

## 2016-08-02 NOTE — Telephone Encounter (Signed)
Called [place] [facility] and submitted PA over the phone. PA approved effective 08/02/16-07/28/17.  [id number].  JY#[id number].

## 2016-08-02 NOTE — Progress Notes (Signed)
Reason for visit: Multiple sclerosis  Frances Dudley is an 31 y.o. female  History of present illness:  Frances Dudley is a 31 year old right-handed black female with a history of multiple sclerosis. The patient is markedly overweight, she has developed pseudotumor cerebri, MRI of the brain done in 2017 suggested this diagnosis, and lumbar puncture was done showing an opening pressure of 33. The patient has migraine headaches, but she may be having headaches as well from the pseudotumor cerebri. The patient has not noted any new vision changes, she has had some increased discomfort in the feet, she denies any new numbness or weakness of extremities or changes in bowel or bladder control. She has not had any balance issues or falls. She is on Tysabri, she has requested a second opinion through [facility] which will occur next week. The patient is on Lasix for her pseudotumor cerebri, she could not tolerate Diamox. The patient is getting Botox for her migraine, but this has not been completely effective, she continues to have daily headaches. She does note some neck stiffness, she denies any muffled hearing. She has significant issues with fibromyalgia pain throughout, and she has underlying depression on Celexa and Seroquel. She returns to this office for an evaluation.  Past Medical History:  Diagnosis Date  . Chronic migraine without aura or status migrainosus 04/12/2014  . Depression   . Fall   . Fibromyalgia 04/12/2014  . Migraine   . Multiple sclerosis (HCC)   . Pseudotumor cerebri 03/23/2016  . TB lung, latent     Past Surgical History:  Procedure Laterality Date  . NONE      Family History  Problem Relation Age of Onset  . Adopted: Yes  . Fibromyalgia Mother     Social history:  reports that she has never smoked. She has never used smokeless tobacco. She reports that she does not drink alcohol or use drugs.    Allergies  Allergen Reactions  . Amitriptyline   .  Baclofen     Headache, nausea  . Chlorhexidine Gluconate Itching     CHG wipe causes skin to be red and itchy - use alcohol when starting iv site   . Diamox [Acetazolamide]     Weakness, stomach upset  . Diazepam Nausea Only  . Sumatriptan Nausea And Vomiting and Other (See Comments)    lockjaw  . Zonegran [Zonisamide]     Intolerance     Medications:  Prior to Admission medications   Medication Sig Start Date End Date Taking? Authorizing Provider  amphetamine-dextroamphetamine (ADDERALL) 10 MG tablet Take 1 tablet (10 mg total) by mouth 2 (two) times daily with a meal. 05/03/16  Yes Melvyn Novas, MD  Cholecalciferol (VITAMIN D) 2000 UNITS CAPS Take 6,000 Units by mouth daily.    Yes Historical Provider, MD  citalopram (CELEXA) 40 MG tablet Take 1 tablet (40 mg total) by mouth daily. 08/02/16  Yes York Spaniel, MD  dexlansoprazole (DEXILANT) 60 MG capsule Take 1 capsule (60 mg total) by mouth daily. 07/30/16  Yes Hilarie Fredrickson, MD  fluticasone [facility]) 50 MCG/ACT nasal spray Place 2 sprays into both nostrils daily. X 14 days 06/22/14  Yes Mathis Fare Presson, PA  furosemide (LASIX) 20 MG tablet Take 2 tablets (40 mg total) by mouth daily. 08/02/16  Yes York Spaniel, MD  ibuprofen (ADVIL,MOTRIN) 800 MG tablet Take 800 mg by mouth 2 (two) times daily.   Yes Historical Provider, MD  loratadine (CLARITIN) 10 MG  tablet Take 1 tablet (10 mg total) by mouth daily. 04/06/14  Yes Massie Maroon, FNP  Melatonin 5 MG TABS Take by mouth.   Yes Historical Provider, MD  natalizumab (TYSABRI) 300 MG/15ML injection Inject into the vein. Every 28 days   Yes Historical Provider, MD  pantoprazole (PROTONIX) 40 MG tablet TAKE 1 TABLET (40 MG TOTAL) BY MOUTH DAILY. 07/10/16  Yes Henrietta Hoover, NP  potassium chloride (K-DUR) 10 MEQ tablet Take 4 tablets (40 mEq total) by mouth daily. 08/02/16  Yes York Spaniel, MD  pyridOXINE (VITAMIN B-6) 100 MG tablet Take 100 mg by mouth at bedtime.    Yes  Historical Provider, MD  QUEtiapine (SEROQUEL) 100 MG tablet TAKE 1 TABLET (100 MG TOTAL) BY MOUTH AT BEDTIME. 07/31/16  Yes York Spaniel, MD  phentermine 37.5 MG capsule Take 1 capsule (37.5 mg total) by mouth every morning. 08/02/16   York Spaniel, MD  pregabalin (LYRICA) 150 MG capsule Take 1 capsule (150 mg total) by mouth 2 (two) times daily. 08/02/16   York Spaniel, MD    ROS:  Out of a complete 14 system review of symptoms, the patient complains only of the following symptoms, and all other reviewed systems are negative.  Fatigue Hearing loss, ringing in the ears Chest tightness Excessive thirst Abdominal pain, nausea, vomiting Insomnia, daytime sleepiness Back pain, achy muscles, muscle cramps, walking difficulty, neck pain Bruising easily Memory loss, headache, numbness, weakness, tremors Agitation, depression, anxiety  Blood pressure 102/82, pulse 86, height 5\' 2"  (1.575 m), weight 240 lb 8 oz (109.1 kg), last menstrual period 07/12/2016.  Physical Exam  General: The patient is alert and cooperative at the time of the examination. The patient has a flat affect. She is markedly obese.  Skin: No significant peripheral edema is noted.   Neurologic Exam  Mental status: The patient is alert and oriented x 3 at the time of the examination. The patient has apparent normal recent and remote memory, with an apparently normal attention span and concentration ability.   Cranial nerves: Facial symmetry is present. Speech is normal, no aphasia or dysarthria is noted. Extraocular movements are full. Visual fields are full. Pupils are equal, round, and reactive to light. Discs are flat bilaterally.  Motor: The patient has good strength in all 4 extremities.  Sensory examination: Soft touch sensation is symmetric on the face, arms, and legs.  Coordination: The patient has good finger-nose-finger and heel-to-shin bilaterally.  Gait and station: The patient has a normal gait.  Tandem gait is normal. Romberg is negative. No drift is seen.  Reflexes: Deep tendon reflexes are symmetric.   MRI brain 02/15/17:  IMPRESSION:  Mildly abnormal MRI brain (with and without) demonstrating: 1. Few small foci of periventricular, subcortical, juxtacortical T2 hyperintensities. No abnormal lesions are seen on post contrast views. Findings are nonspecific but can be compatible patient's diagnosis of multiple sclerosis. 2. Enlarged partially empty sella and enlarged optic nerve sheaths associated with tortuous bilateral optic nerves. Findings are nonspecific but can be seen in association with idiopathic intracranial hypertension. 3. Compared to MRI on 12/27/14, no significant change.  * MRI scan images were reviewed online. I agree with the written report.    Assessment/Plan:  1. Multiple sclerosis  2. Migraine headache  3. Pseudotumor cerebri  4. Fibromyalgia  5. Obesity  6. Depression  The patient has multiple medical issues. She will be increased on her Lyrica taking 150 mg twice daily, she will go on  phentermine for weight loss. She was given a prescription for her Celexa, Lasix will be increased to 40 mg daily and the potassium supplementation will be increased to 40 mEq daily. The patient will have blood work done today to include a JC virus antibody. The patient will follow-up in 6 months, she will continue her Botox treatments, she will be referred to ophthalmology for routine follow-up. She will be seen through [facility] in the near future.  Marlan Palau MD 08/02/2016 9:39 AM  [facility] [address] [place], [place] [address]  Phone [phone] Fax [phone]

## 2016-08-03 ENCOUNTER — Telehealth: Payer: Self-pay | Admitting: *Deleted

## 2016-08-03 LAB — COMPREHENSIVE METABOLIC PANEL
ALK PHOS: 56 IU/L (ref 39–117)
ALT: 12 IU/L (ref 0–32)
AST: 16 IU/L (ref 0–40)
Albumin/Globulin Ratio: 1.4 (ref 1.2–2.2)
Albumin: 3.9 g/dL (ref 3.5–5.5)
BILIRUBIN TOTAL: 0.3 mg/dL (ref 0.0–1.2)
BUN/Creatinine Ratio: 12 (ref 9–23)
BUN: 9 mg/dL (ref 6–20)
CHLORIDE: 101 mmol/L (ref 96–106)
CO2: 24 mmol/L (ref 18–29)
CREATININE: 0.76 mg/dL (ref 0.57–1.00)
Calcium: 9.3 mg/dL (ref 8.7–10.2)
GFR calc Af Amer: 123 mL/min/{1.73_m2} (ref >59)
GFR calc non Af Amer: 107 mL/min/{1.73_m2} (ref >59)
GLUCOSE: 89 mg/dL (ref 65–99)
Globulin, Total: 2.7 g/dL (ref 1.5–4.5)
Potassium: 3.6 mmol/L (ref 3.5–5.2)
Sodium: 139 mmol/L (ref 134–144)
Total Protein: 6.6 g/dL (ref 6.0–8.5)

## 2016-08-03 NOTE — Telephone Encounter (Signed)
Called and spoke to pt about lab results per CW,MD note. Patient verbalized understanding.

## 2016-08-03 NOTE — Telephone Encounter (Signed)
-----   Message from York Spaniel, MD sent at 08/03/2016  7:27 AM EST -----   The blood work results are unremarkable. JC virus antibody is pending. Please call the patient. ----- Message ----- From: Nell Range Lab Results In Sent: 08/03/2016   5:41 AM To: York Spaniel, MD

## 2016-08-07 ENCOUNTER — Encounter (HOSPITAL_COMMUNITY): Admission: RE | Admit: 2016-08-07 | Payer: Medicaid Other | Source: Ambulatory Visit

## 2016-08-09 ENCOUNTER — Telehealth: Payer: Self-pay | Admitting: Neurology

## 2016-08-09 NOTE — Telephone Encounter (Signed)
JC viral antibody panel is negative.

## 2016-08-15 ENCOUNTER — Other Ambulatory Visit: Payer: Self-pay | Admitting: Neurology

## 2016-08-15 DIAGNOSIS — G4719 Other hypersomnia: Secondary | ICD-10-CM

## 2016-08-15 MED ORDER — AMPHETAMINE-DEXTROAMPHETAMINE 10 MG PO TABS: 10.0000 mg | ORAL_TABLET | Freq: Two times a day (BID) | ORAL | 0 refills | Status: DC

## 2016-08-15 NOTE — Addendum Note (Signed)
Addended by: Geronimo Running A on: 08/15/2016 04:11 PM   Modules accepted: Orders

## 2016-08-15 NOTE — Telephone Encounter (Signed)
Patient called office to request refill for amphetamine-dextroamphetamine (ADDERALL) 10 MG tablet

## 2016-08-16 MED ORDER — AMPHETAMINE-DEXTROAMPHETAMINE 10 MG PO TABS: 10.0000 mg | ORAL_TABLET | Freq: Two times a day (BID) | ORAL | 0 refills | Status: DC

## 2016-08-16 NOTE — Telephone Encounter (Signed)
Adderall RX signed by Dr.Dohmeier printed on regular paper. Will reprint on RX paper.

## 2016-08-16 NOTE — Telephone Encounter (Signed)
I called pt and advised her that her adderall RX is ready for pick up at the front desk and gave clinic hours. Pt verbalized understanding.

## 2016-08-16 NOTE — Addendum Note (Signed)
Addended by: Geronimo Running A on: 08/16/2016 07:08 AM   Modules accepted: Orders

## 2016-08-30 ENCOUNTER — Telehealth: Payer: Self-pay

## 2016-08-30 NOTE — Telephone Encounter (Signed)
Prior authorization for Dexilant denied by [employer]

## 2016-09-03 ENCOUNTER — Telehealth: Payer: Self-pay | Admitting: Neurology

## 2016-09-03 NOTE — Telephone Encounter (Signed)
The patient was seen by Dr. Sinda Du. He felt that her ophthalmologic evaluation was unremarkable, no significant papilledema was seen.

## 2016-09-05 ENCOUNTER — Telehealth: Payer: Self-pay | Admitting: Neurology

## 2016-09-05 NOTE — Telephone Encounter (Signed)
Pt wants Dr Anne Hahn to be aware that on yesterday she had a sharp pain across her hips & lower back it lasted about 10 mins. She said it was like a contraction but In her back.

## 2016-09-06 ENCOUNTER — Encounter (HOSPITAL_COMMUNITY)
Admission: RE | Admit: 2016-09-06 | Discharge: 2016-09-06 | Disposition: A | Discharge status: Home / Self Care | Payer: Medicaid Other | Source: Ambulatory Visit | Attending: Neurology | Admitting: Neurology

## 2016-09-06 ENCOUNTER — Encounter (HOSPITAL_COMMUNITY): Payer: Self-pay

## 2016-09-06 ENCOUNTER — Other Ambulatory Visit: Payer: Self-pay | Admitting: Neurology

## 2016-09-06 DIAGNOSIS — G35 Multiple sclerosis: Secondary | ICD-10-CM

## 2016-09-06 MED ORDER — LORATADINE 10 MG PO TABS
10.0000 mg | ORAL_TABLET | ORAL | Status: DC
  Administered 2016-09-06: 10 mg via ORAL
  Filled 2016-09-06: qty 1

## 2016-09-06 MED ORDER — SODIUM CHLORIDE 0.9 % IV SOLN
300.0000 mg | INTRAVENOUS | Status: DC
  Administered 2016-09-06: 300 mg via INTRAVENOUS
  Filled 2016-09-06: qty 15

## 2016-09-06 MED ORDER — ACETAMINOPHEN 325 MG PO TABS
650.0000 mg | ORAL_TABLET | ORAL | Status: DC
  Administered 2016-09-06: 650 mg via ORAL
  Filled 2016-09-06: qty 2

## 2016-09-06 MED ORDER — SODIUM CHLORIDE 0.9 % IV SOLN
INTRAVENOUS | Status: DC
  Administered 2016-09-06: 09:00:00 via INTRAVENOUS

## 2016-09-06 NOTE — Progress Notes (Signed)
Patient only stayed 30 min out of recommended time of one hour post infusion. Aware to call MD if any problems or questions.

## 2016-09-06 NOTE — Discharge Instructions (Signed)
°Tysabri °Natalizumab injection °What is this medicine? °NATALIZUMAB (na ta LIZ you mab) is used to treat relapsing multiple sclerosis. This drug is not a cure. It is also used to treat Crohn's disease. °This medicine may be used for other purposes; ask your health care provider or pharmacist if you have questions. °COMMON BRAND NAME(S): Tysabri °What should I tell my health care provider before I take this medicine? °They need to know if you have any of these conditions: °-immune system problems °-progressive multifocal leukoencephalopathy (PML) °-an unusual or allergic reaction to natalizumab, other medicines, foods, dyes, or preservatives °-pregnant or trying to get pregnant °-breast-feeding °How should I use this medicine? °This medicine is for infusion into a vein. It is given by a health care professional in a hospital or clinic setting. °A special MedGuide will be given to you by the pharmacist with each prescription and refill. Be sure to read this information carefully each time. °Talk to your pediatrician regarding the use of this medicine in children. This medicine is not approved for use in children. °Overdosage: If you think you have taken too much of this medicine contact a poison control center or emergency room at once. °NOTE: This medicine is only for you. Do not share this medicine with others. °What if I miss a dose? °It is important not to miss your dose. Call your doctor or health care professional if you are unable to keep an appointment. °What may interact with this medicine? °-azathioprine °-cyclosporine °-interferon °-6-mercaptopurine °-methotrexate °-steroid medicines like prednisone or cortisone °-TNF-alpha inhibitors like adalimumab, etanercept, and infliximab °-vaccines °This list may not describe all possible interactions. Give your health care provider a list of all the medicines, herbs, non-prescription drugs, or dietary supplements you use. Also tell them if you smoke, drink  alcohol, or use illegal drugs. Some items may interact with your medicine. °What should I watch for while using this medicine? °Your condition will be monitored carefully while you are receiving this medicine. Visit your doctor for regular check ups. Tell your doctor or healthcare professional if your symptoms do not start to get better or if they get worse. °Stay away from people who are sick. Call your doctor or health care professional for advice if you get a fever, chills or sore throat, or other symptoms of a cold or flu. Do not treat yourself. °In some patients, this medicine may cause a serious brain infection that may cause death. If you have any problems seeing, thinking, speaking, walking, or standing, tell your doctor right away. If you cannot reach your doctor, get urgent medical care. °What side effects may I notice from receiving this medicine? °Side effects that you should report to your doctor or health care professional as soon as possible: °-allergic reactions like skin rash, itching or hives, swelling of the face, lips, or tongue °-breathing problems °-changes in vision °-chest pain °-dark urine °-depression, feelings of sadness °-dizziness °-general ill feeling or flu-like symptoms °-irregular, missed, or painful menstrual periods °-light-colored stools °-loss of appetite, nausea °-muscle weakness °-problems with balance, talking, or walking °-right upper belly pain °-unusually weak or tired °-yellowing of the eyes or skin °Side effects that usually do not require medical attention (report to your doctor or health care professional if they continue or are bothersome): °-aches, pains °-headache °-stomach upset °-tiredness °This list may not describe all possible side effects. Call your doctor for medical advice about side effects. You may report side effects to FDA at 1-800-FDA-1088. °Where should I   keep my medicine? °This drug is given in a hospital or clinic and will not be stored at  home. °NOTE: This sheet is a summary. It may not cover all possible information. If you have questions about this medicine, talk to your doctor, pharmacist, or health care provider. °© 2018 [employer]vier/Gold Standard (2008-07-03 13:33:21) ° °

## 2016-09-20 ENCOUNTER — Ambulatory Visit (INDEPENDENT_AMBULATORY_CARE_PROVIDER_SITE_OTHER): Payer: Medicaid Other | Admitting: Neurology

## 2016-09-20 ENCOUNTER — Encounter: Payer: Self-pay | Admitting: Neurology

## 2016-09-20 VITALS — BP 121/82 | HR 93 | Ht 62.0 in

## 2016-09-20 DIAGNOSIS — G43719 Chronic migraine without aura, intractable, without status migrainosus: Secondary | ICD-10-CM | POA: Diagnosis not present

## 2016-09-20 NOTE — Progress Notes (Signed)
Please refer to Botox procedure note.  The patient has recently been seen through [facility] neurology for a second opinion. Patient has been sent for neuropsychological evaluation for possible depression. This evaluation has been done, the results are pending.

## 2016-09-20 NOTE — Procedures (Signed)
     BOTOX PROCEDURE NOTE FOR MIGRAINE HEADACHE   HISTORY: Frances Dudley is a 31 year old patient with a history of intractable migraine headaches. Following the Botox injection she gets benefit with the headaches, the headaches may go down to once a week. Within about a month prior to the next Botox injection the headaches once again return and become daily in nature. The patient returns for a Botox injection.   Description of procedure:  The patient was placed in a sitting position. The standard protocol was used for Botox as follows, with 5 units of Botox injected at each site:   -Procerus muscle, midline injection  -Corrugator muscle, bilateral injection  -Frontalis muscle, bilateral injection, with 2 sites each side, medial injection was performed in the upper one third of the frontalis muscle, in the region vertical from the medial inferior edge of the superior orbital rim. The lateral injection was again in the upper one third of the forehead vertically above the lateral limbus of the cornea, 1.5 cm lateral to the medial injection site.  -Temporalis muscle injection, 4 sites, bilaterally. The first injection was 3 cm above the tragus of the ear, second injection site was 1.5 cm to 3 cm up from the first injection site in line with the tragus of the ear. The third injection site was 1.5-3 cm forward between the first 2 injection sites. The fourth injection site was 1.5 cm posterior to the second injection site.  -Occipitalis muscle injection, 3 sites, bilaterally. The first injection was done one half way between the occipital protuberance and the tip of the mastoid process behind the ear. The second injection site was done lateral and superior to the first, 1 fingerbreadth from the first injection. The third injection site was 1 fingerbreadth superiorly and medially from the first injection site.  -Cervical paraspinal muscle injection, 2 sites, bilateral, the first injection site  was 1 cm from the midline of the cervical spine, 3 cm inferior to the lower border of the occipital protuberance. The second injection site was 1.5 cm superiorly and laterally to the first injection site.  -Trapezius muscle injection was performed at 3 sites, bilaterally. The first injection site was in the upper trapezius muscle halfway between the inflection point of the neck, and the acromion. The second injection site was one half way between the acromion and the first injection site. The third injection was done between the first injection site and the inflection point of the neck.   A 200 unit bottle of Botox was used, 155 units were injected, the rest of the Botox was wasted. The patient tolerated the procedure well, there were no complications of the above procedure.  Botox NDC 2263-3354-56 Lot number [id number] Expiration date 03/2019

## 2016-09-24 ENCOUNTER — Other Ambulatory Visit: Payer: Self-pay | Admitting: Neurology

## 2016-09-24 DIAGNOSIS — G4719 Other hypersomnia: Secondary | ICD-10-CM

## 2016-09-24 NOTE — Telephone Encounter (Signed)
Pt calling for a refill of amphetamine-dextroamphetamine (ADDERALL) 10 MG tablet °

## 2016-09-25 MED ORDER — AMPHETAMINE-DEXTROAMPHETAMINE 10 MG PO TABS: 10.0000 mg | ORAL_TABLET | Freq: Two times a day (BID) | ORAL | 0 refills | Status: DC

## 2016-09-25 NOTE — Addendum Note (Signed)
Addended by: Geronimo Running A on: 09/25/2016 08:07 AM   Modules accepted: Orders

## 2016-09-25 NOTE — Telephone Encounter (Signed)
I called pt and advised her that her adderall RX is ready for pick up at the front desk. Pt verbalized understanding.  

## 2016-10-04 ENCOUNTER — Encounter (HOSPITAL_COMMUNITY): Payer: Medicaid Other

## 2016-10-09 ENCOUNTER — Encounter (HOSPITAL_COMMUNITY): Admission: RE | Admit: 2016-10-09 | Payer: Medicaid Other | Source: Ambulatory Visit

## 2016-11-01 ENCOUNTER — Encounter (HOSPITAL_COMMUNITY): Payer: Medicaid Other

## 2016-11-06 ENCOUNTER — Encounter (HOSPITAL_COMMUNITY): Admission: RE | Admit: 2016-11-06 | Payer: Medicaid Other | Source: Ambulatory Visit | Admitting: Neurology

## 2016-11-21 ENCOUNTER — Telehealth: Payer: Self-pay | Admitting: Neurology

## 2016-11-21 DIAGNOSIS — G4719 Other hypersomnia: Secondary | ICD-10-CM

## 2016-11-21 MED ORDER — AMPHETAMINE-DEXTROAMPHETAMINE 10 MG PO TABS: 10.0000 mg | ORAL_TABLET | Freq: Two times a day (BID) | ORAL | 0 refills | Status: DC

## 2016-11-21 NOTE — Telephone Encounter (Signed)
Placed printed/signed rx adderall up front for patient pick up.  

## 2016-11-21 NOTE — Telephone Encounter (Signed)
The Adderall will be refilled. 

## 2016-11-21 NOTE — Telephone Encounter (Signed)
Pt calling for refill of amphetamine-dextroamphetamine (ADDERALL) 10 MG tablet °

## 2016-11-21 NOTE — Addendum Note (Signed)
Addended by: York Spaniel on: 11/21/2016 11:32 AM   Modules accepted: Orders

## 2016-11-30 ENCOUNTER — Encounter (HOSPITAL_COMMUNITY): Payer: Medicaid Other

## 2016-12-04 ENCOUNTER — Encounter (HOSPITAL_COMMUNITY)
Admission: RE | Admit: 2016-12-04 | Discharge: 2016-12-04 | Disposition: A | Discharge status: Home / Self Care | Payer: Medicaid Other | Source: Ambulatory Visit | Attending: Neurology | Admitting: Neurology

## 2016-12-04 ENCOUNTER — Encounter (INDEPENDENT_AMBULATORY_CARE_PROVIDER_SITE_OTHER): Payer: Self-pay

## 2016-12-04 ENCOUNTER — Encounter (HOSPITAL_COMMUNITY): Payer: Self-pay

## 2016-12-04 DIAGNOSIS — G35 Multiple sclerosis: Secondary | ICD-10-CM | POA: Diagnosis not present

## 2016-12-04 MED ORDER — SODIUM CHLORIDE 0.9 % IV SOLN
300.0000 mg | INTRAVENOUS | Status: DC
  Administered 2016-12-04: 300 mg via INTRAVENOUS
  Filled 2016-12-04: qty 15

## 2016-12-04 MED ORDER — LORATADINE 10 MG PO TABS
10.0000 mg | ORAL_TABLET | ORAL | Status: DC
  Administered 2016-12-04: 10 mg via ORAL
  Filled 2016-12-04: qty 1

## 2016-12-04 MED ORDER — SODIUM CHLORIDE 0.9 % IV SOLN
INTRAVENOUS | Status: DC
  Administered 2016-12-04: 09:00:00 via INTRAVENOUS

## 2016-12-04 MED ORDER — ACETAMINOPHEN 325 MG PO TABS
650.0000 mg | ORAL_TABLET | ORAL | Status: DC
  Administered 2016-12-04: 650 mg via ORAL
  Filled 2016-12-04: qty 2

## 2016-12-17 ENCOUNTER — Telehealth: Payer: Self-pay | Admitting: *Deleted

## 2016-12-17 NOTE — Telephone Encounter (Signed)
Faxed completed/signed tysabri patient status report and re-auth questionnaire to [facility]. Fax: [phone]. Received confirmation.

## 2016-12-18 ENCOUNTER — Telehealth: Payer: Self-pay | Admitting: Neurology

## 2016-12-18 NOTE — Telephone Encounter (Signed)
I would agree, the patient has not gotten a dramatic improvement with Botox, we may need to give a trial on Aimovig.

## 2016-12-18 NOTE — Telephone Encounter (Signed)
This patient does not meat the Medicaid requirements for continuation of botox because has not had a 50 percent reduction in head ache days and is still having daily head aches. Please advise.

## 2016-12-20 ENCOUNTER — Other Ambulatory Visit: Payer: Self-pay | Admitting: Neurology

## 2016-12-20 DIAGNOSIS — G35 Multiple sclerosis: Secondary | ICD-10-CM

## 2016-12-24 NOTE — Telephone Encounter (Signed)
I called and relayed this information to the patient. She was ok with this and would like to try Aimovig. She would like to know when she will be able to start the drug. Please call and advise.

## 2016-12-24 NOTE — Telephone Encounter (Addendum)
Dr Anne Hahn- please advise. I gave a form to you to sign. I filled out the rest if you are okay with patient starting medication. It will take 6-8 weeks for them to process her information/for her to get medication. She would have to stop by office to sign form.

## 2016-12-24 NOTE — Telephone Encounter (Signed)
I have signed the form for Aimovig.

## 2016-12-25 ENCOUNTER — Other Ambulatory Visit: Payer: Self-pay | Admitting: Neurology

## 2016-12-25 NOTE — Telephone Encounter (Signed)
Called and LVM for patient advising her that she needs to stop in the office to sign form to be able to release her information to [employer]. Also advised process can take 6-8 weeks. Gave [facility] phone number if she has any questions.

## 2016-12-25 NOTE — Telephone Encounter (Signed)
Tara/[facility] [phone] called said botox had been approved. She was calling to schedule delivery. Duwayne Heck was skyped, she was not aware it had been approved she requested Delice Bison to hold the medication until she talks with the patient.

## 2016-12-25 NOTE — Telephone Encounter (Signed)
Faxed printed/signed rx phentermine to pt pharmacy. [phone]. Received confirmation.

## 2016-12-26 ENCOUNTER — Telehealth: Payer: Self-pay | Admitting: Neurology

## 2016-12-26 ENCOUNTER — Ambulatory Visit: Payer: Medicaid Other | Admitting: Neurology

## 2016-12-26 ENCOUNTER — Ambulatory Visit: Payer: Medicaid Other | Admitting: Family Medicine

## 2016-12-26 DIAGNOSIS — G4719 Other hypersomnia: Secondary | ICD-10-CM

## 2016-12-26 MED ORDER — PROPRANOLOL HCL 20 MG PO TABS: 20.0000 mg | ORAL_TABLET | Freq: Two times a day (BID) | ORAL | 3 refills | Status: DC

## 2016-12-26 MED ORDER — AMPHETAMINE-DEXTROAMPHETAMINE 10 MG PO TABS: 10.0000 mg | ORAL_TABLET | Freq: Two times a day (BID) | ORAL | 0 refills | Status: DC

## 2016-12-26 NOTE — Telephone Encounter (Signed)
Patient is calling regarding Botox denial and another medication Dr. Anne Hahn is going to try.

## 2016-12-26 NOTE — Telephone Encounter (Signed)
The Adderall will be refilled. 

## 2016-12-26 NOTE — Telephone Encounter (Signed)
Called and with patient. She stated that since botox denied, she is wondering about new medication. I advised this would be the aimovig. She stated she did not receive my message I left for her previously. I advised she needs to come sign form so we can release her information to [employer]. Advised process can take about 6-8 weeks.   She states migraines have become daily and have worsened since she is due for botox again. She is wondering how she should manage migraines until she is able to start new medication. Advised I will send message to CW,MD to advise.  Best contact number for her: [phone]

## 2016-12-26 NOTE — Telephone Encounter (Signed)
I called patient. Botox was denied, she is awaiting Aimovig.  We will try propranolol, she has been on this in the past before she came to [facility], she does not recall whether or not she had problems tolerating the drug or whether worked or not.

## 2016-12-26 NOTE — Addendum Note (Signed)
Addended by: York Spaniel on: 12/26/2016 05:12 PM   Modules accepted: Orders

## 2016-12-26 NOTE — Addendum Note (Signed)
Addended by: York Spaniel on: 12/26/2016 05:04 PM   Modules accepted: Orders

## 2016-12-26 NOTE — Telephone Encounter (Signed)
Patient requesting refill of amphetamine-dextroamphetamine (ADDERALL) 10 MG tablet. ° ° °

## 2016-12-27 NOTE — Telephone Encounter (Signed)
Placed printed/signed rx adderall up front for patient pick up.  

## 2016-12-28 ENCOUNTER — Telehealth: Payer: Self-pay | Admitting: *Deleted

## 2016-12-28 NOTE — Telephone Encounter (Signed)
Faxed completed/signed aimovig service request form and prescription to [facility]. Fax: [phone]. Received confirmation.   

## 2016-12-28 NOTE — Telephone Encounter (Signed)
Received fax notification from [employer] that enrollment form received.

## 2017-01-04 ENCOUNTER — Encounter (HOSPITAL_COMMUNITY)
Admission: RE | Admit: 2017-01-04 | Discharge: 2017-01-04 | Disposition: A | Discharge status: Home / Self Care | Payer: Medicaid Other | Source: Ambulatory Visit | Attending: Neurology | Admitting: Neurology

## 2017-01-04 DIAGNOSIS — G35 Multiple sclerosis: Secondary | ICD-10-CM | POA: Insufficient documentation

## 2017-01-20 ENCOUNTER — Other Ambulatory Visit: Payer: Self-pay | Admitting: Neurology

## 2017-02-05 ENCOUNTER — Encounter (HOSPITAL_COMMUNITY): Admission: RE | Admit: 2017-02-05 | Payer: Medicaid Other | Source: Ambulatory Visit

## 2017-02-07 ENCOUNTER — Telehealth: Payer: Self-pay | Admitting: Neurology

## 2017-02-07 ENCOUNTER — Ambulatory Visit: Payer: Medicaid Other | Admitting: Neurology

## 2017-02-07 NOTE — Telephone Encounter (Signed)
This patient canceled the same day of the appointment. 

## 2017-02-11 ENCOUNTER — Encounter: Payer: Self-pay | Admitting: Neurology

## 2017-02-25 ENCOUNTER — Other Ambulatory Visit: Payer: Self-pay | Admitting: Neurology

## 2017-02-27 ENCOUNTER — Other Ambulatory Visit: Payer: Self-pay | Admitting: Neurology

## 2017-02-27 ENCOUNTER — Telehealth: Payer: Self-pay | Admitting: Neurology

## 2017-02-27 DIAGNOSIS — G4719 Other hypersomnia: Secondary | ICD-10-CM

## 2017-02-27 MED ORDER — AMPHETAMINE-DEXTROAMPHETAMINE 10 MG PO TABS: 10.0000 mg | ORAL_TABLET | Freq: Two times a day (BID) | ORAL | 0 refills | Status: AC

## 2017-02-27 NOTE — Telephone Encounter (Signed)
Pt request refill for amphetamine-dextroamphetamine (ADDERALL) 10 MG tablet °

## 2017-02-27 NOTE — Telephone Encounter (Signed)
Prescription will  Be ready for pick up on 02/28/2017

## 2017-02-28 ENCOUNTER — Telehealth: Payer: Self-pay | Admitting: *Deleted

## 2017-02-28 NOTE — Telephone Encounter (Signed)
Received fax notification from [facility] that SRF incomplete. I called and spoke with Crystal. Verified it should be for free trial only per CW,MD. She verbalized understanding and states they have this info. Nothing further needed. They will process for pt.

## 2017-03-05 ENCOUNTER — Encounter (HOSPITAL_COMMUNITY): Admission: RE | Admit: 2017-03-05 | Payer: Medicaid Other | Source: Ambulatory Visit

## 2017-03-06 ENCOUNTER — Telehealth: Payer: Self-pay | Admitting: *Deleted

## 2017-03-06 MED ORDER — ERENUMAB-AOOE 70 MG/ML ~~LOC~~ SOAJ: 140.0000 mg | SUBCUTANEOUS | 11 refills | Status: AC

## 2017-03-06 MED ORDER — ERENUMAB-AOOE 70 MG/ML ~~LOC~~ SOAJ: 140.0000 mg | SUBCUTANEOUS | 11 refills | Status: DC

## 2017-03-06 NOTE — Telephone Encounter (Signed)
Aimovig rx. escribed to retail pharmacy due to [facility] hub overload and per [facility] rep's request/fim 

## 2017-03-06 NOTE — Addendum Note (Signed)
Addended by: Candis Schatz I on: 03/06/2017 11:00 AM   Modules accepted: Orders

## 2017-03-08 ENCOUNTER — Other Ambulatory Visit: Payer: Self-pay | Admitting: Neurology

## 2017-03-11 NOTE — Telephone Encounter (Signed)
Faxed printed/signed rx [facility] [place], [place] at [phone]. Received fax confirmation.

## 2017-03-16 IMAGING — US US PELVIS COMPLETE
1 series · 14 of 25 positions shown · non-contrast
Comparison: No prior.

CLINICAL DATA: Abnormal menses.

EXAM:
TRANSABDOMINAL AND TRANSVAGINAL ULTRASOUND OF PELVIS
TECHNIQUE: Both transabdominal and transvaginal ultrasound examinations of the
pelvis were performed. Transabdominal technique was performed for
global imaging of the pelvis including uterus, ovaries, adnexal
regions, and pelvic cul-de-sac. It was necessary to proceed with
endovaginal exam following the transabdominal exam to visualize the
uterus and ovaries..

[Series 1: us pelvis complete · 0.24mm/px · 14 of 102 slices shown]
[im 1/102]
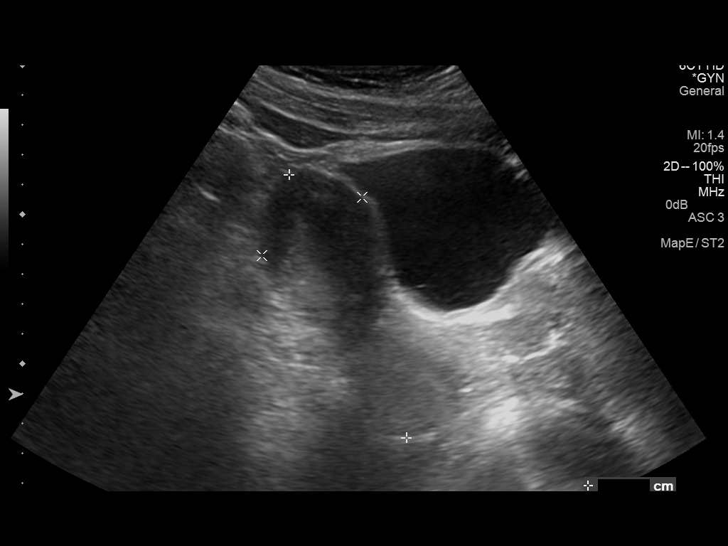
[im 9/102]
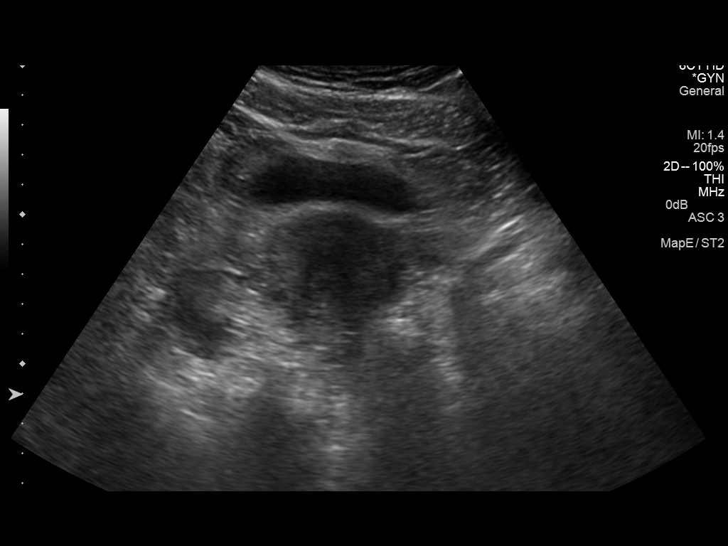
[im 17/102]
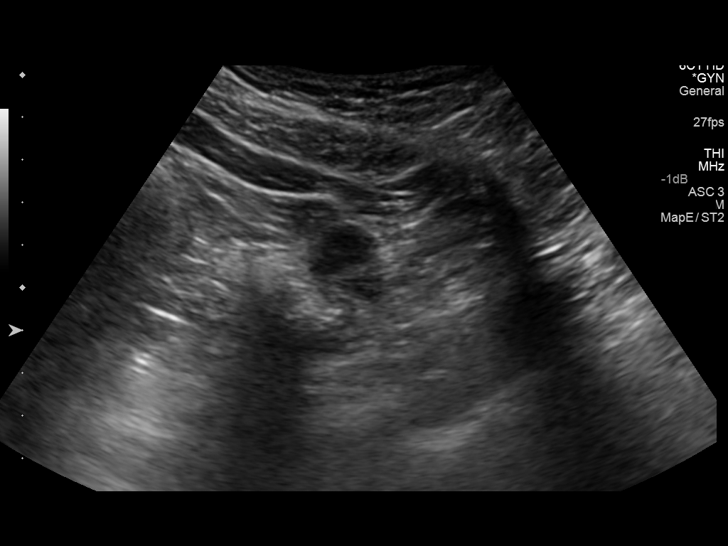
[im 26/102]
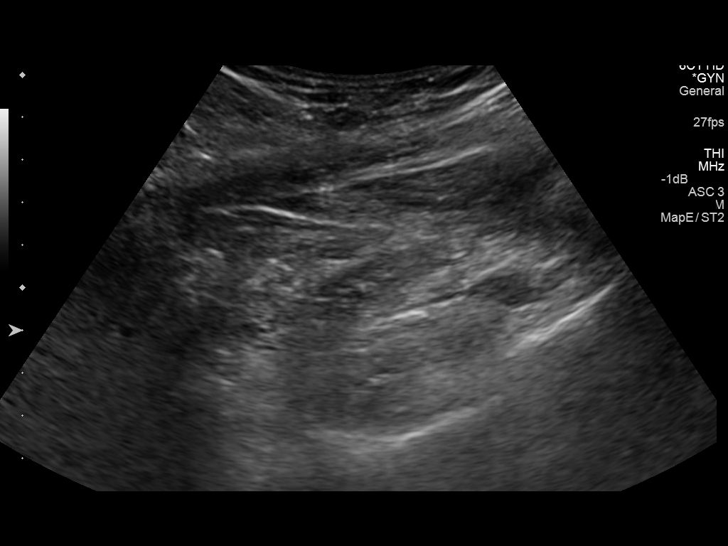
[im 34/102]
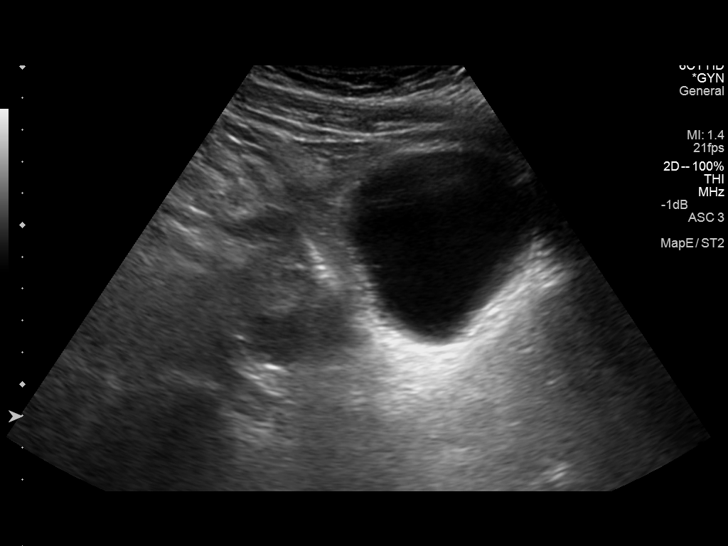
[im 38/102]
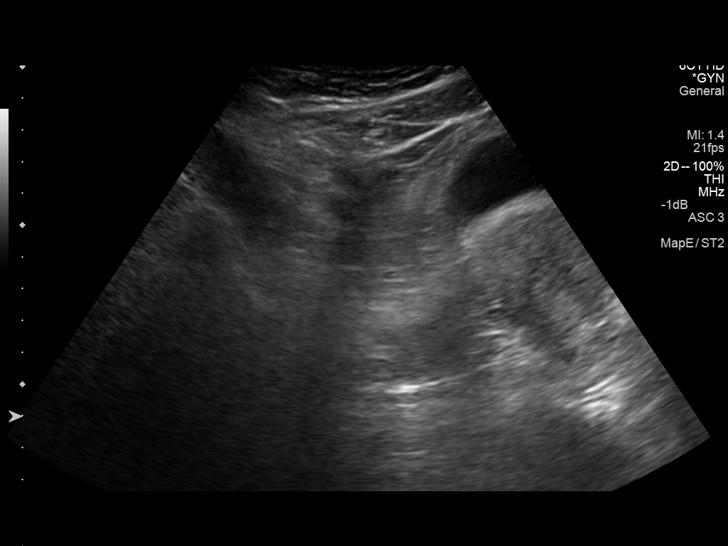
[im 47/102]
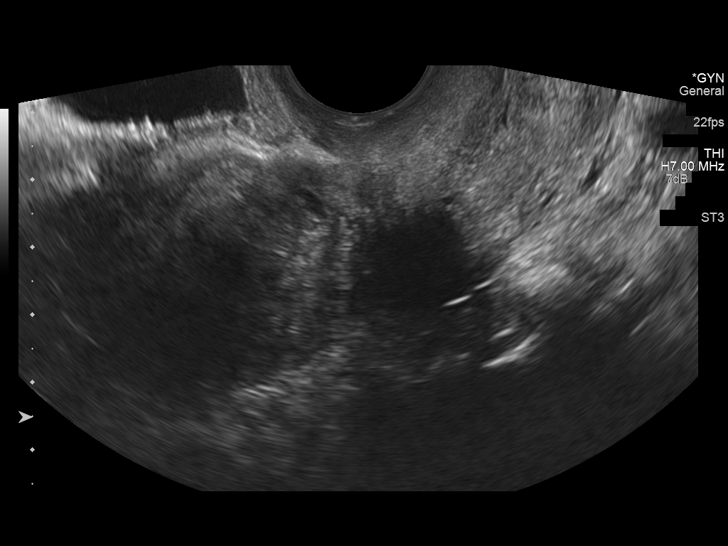
[im 55/102]
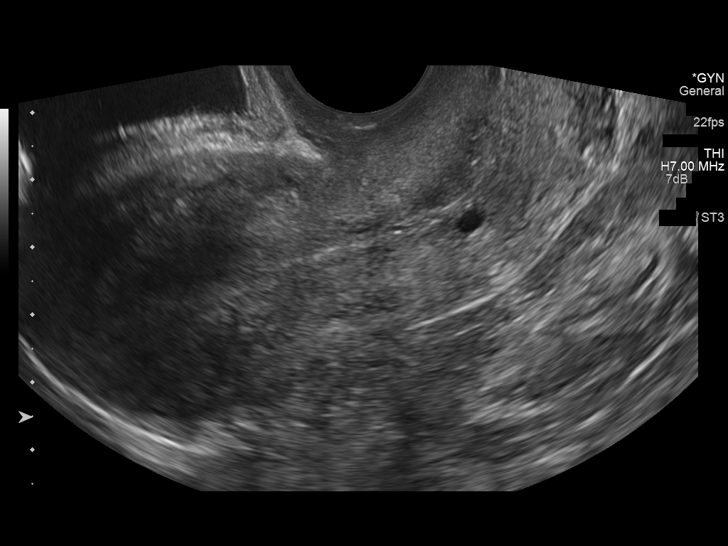
[im 64/102]
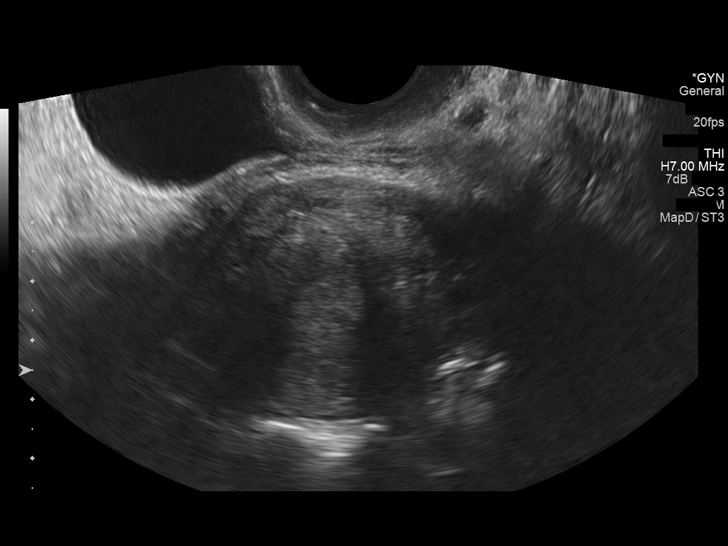
[im 68/102]
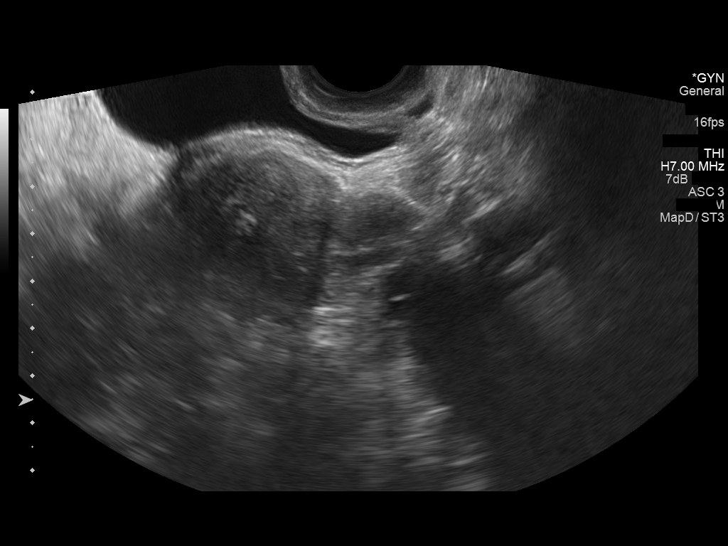
[im 76/102]
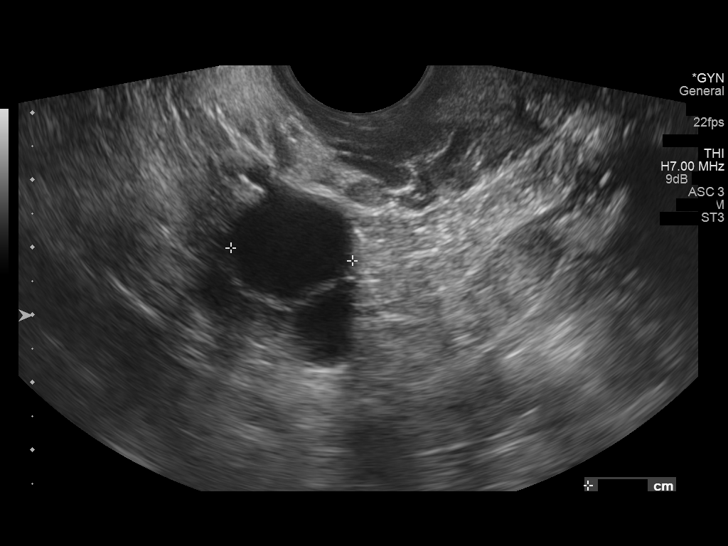
[im 85/102]
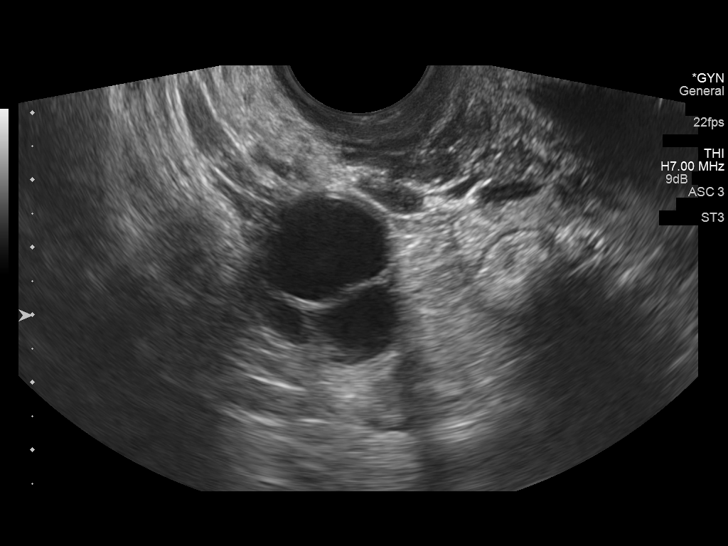
[im 93/102]
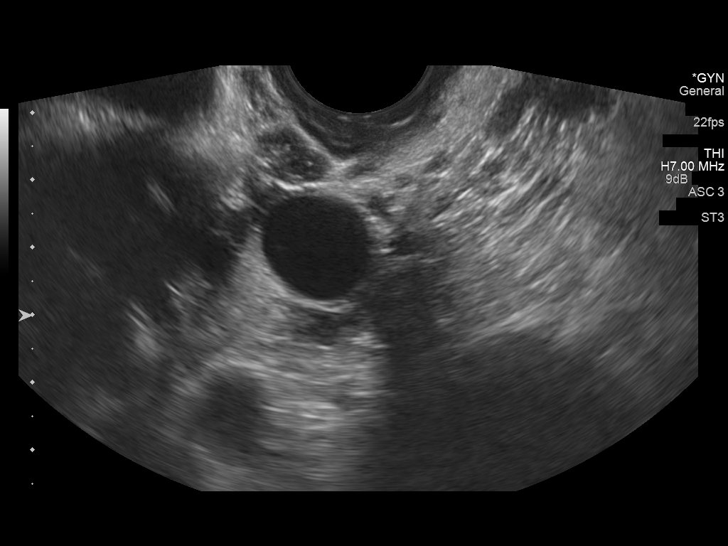
[im 102/102]
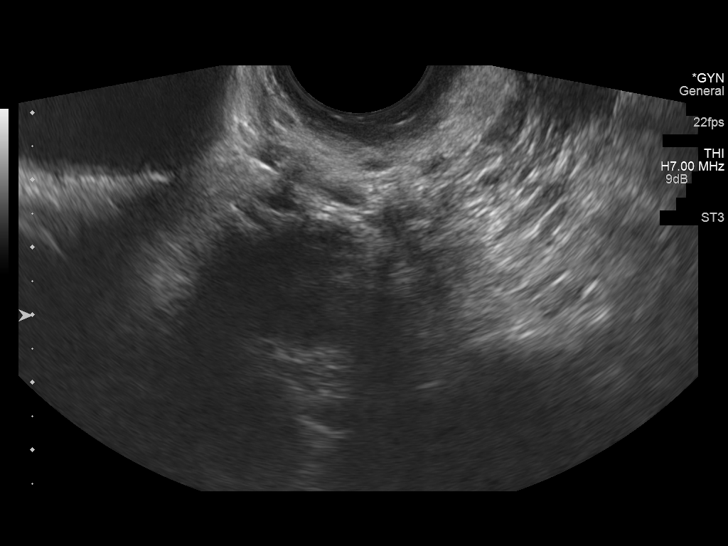

[14 of 25 positions shown; findings below may reference images not displayed]

FINDINGS: Uterus

Measurements: 9.6 x 3.9 x 5.3 cm. No focal abnormality noted. No
fibroids.

Endometrium

Thickness: 5.7 mm.  No focal abnormality visualized.

Right ovary

Measurements: 4.2 x 2.8 x 2.4 cm. Two simple cysts noted. The
largest measures 1.8 cm

Left ovary

Measurements: 2.7 x 1.8 x 1.6 cm. Normal appearance/no adnexal mass.

Other findings

No free fluid.
IMPRESSION: 1. Two simple cyst noted in the right ovary. The largest measures
1.8 cm.

2. No acute or significant abnormality identified. The uterus and
endometrium appear normal. No fibroids noted.

## 2017-03-26 ENCOUNTER — Other Ambulatory Visit: Payer: Self-pay | Admitting: Neurology

## 2017-03-27 ENCOUNTER — Telehealth: Payer: Self-pay | Admitting: *Deleted

## 2017-03-27 NOTE — Telephone Encounter (Addendum)
Called and LVM for pt to call office back. Per Dr Anne Hahn, patient needs to come in for a follow up appt. She missed her appt on 02/07/17. Okay for her to see NP per Dr Anne Hahn if she calls to schedule

## 2017-03-28 NOTE — Telephone Encounter (Signed)
Called mobile number again, LVM for pt to call. Called home number. Female answered, mother only one on [place]. Asked either mother/pt call back. He will have mother call our office later on today.

## 2017-03-29 NOTE — Telephone Encounter (Signed)
Called, LVM again on pt number to call and make appt. Advised we are closed now but reopen Monday at 8am.  I called and spoke with mother (on [place]) who will try to reach out to pt to call Monday and schedule an appt.

## 2017-04-02 ENCOUNTER — Encounter (HOSPITAL_COMMUNITY): Admission: RE | Admit: 2017-04-02 | Payer: Medicaid Other | Source: Ambulatory Visit

## 2017-04-02 ENCOUNTER — Encounter: Payer: Self-pay | Admitting: *Deleted

## 2017-04-02 NOTE — Telephone Encounter (Signed)
Sent pt letter since unable to reach by phone to call and schedule f/u. Can schedule with NP per CW,MD.

## 2017-04-17 ENCOUNTER — Other Ambulatory Visit: Payer: Self-pay | Admitting: Neurology

## 2017-04-17 DIAGNOSIS — G35 Multiple sclerosis: Secondary | ICD-10-CM

## 2017-04-28 ENCOUNTER — Other Ambulatory Visit: Payer: Self-pay | Admitting: Neurology

## 2017-04-30 ENCOUNTER — Encounter (HOSPITAL_COMMUNITY): Payer: Medicaid Other

## 2017-05-13 ENCOUNTER — Other Ambulatory Visit: Payer: Self-pay | Admitting: Neurology

## 2017-05-16 ENCOUNTER — Telehealth: Payer: Self-pay | Admitting: Neurology

## 2017-05-16 NOTE — Telephone Encounter (Signed)
Called [id number] [place], [place] - [address] and spoke with [place]. She stated they last filled rx Aimovig for pt 03/13/17. There are refills remaining, but pt has not picked up.

## 2017-05-16 NOTE — Telephone Encounter (Signed)
Meigan/[facility] [phone] ref# Frances Dudley called to advise the pt was enrolled by the office and the aim[facility] access card will expire soon, it will need a PA to continue.

## 2017-05-17 NOTE — Telephone Encounter (Signed)
Called and spoke with mother, Donnella ShamLadonna again. Advised we have still been unable to reach her daughter. Asked if she could get in contact with her. Dr. Anne HahnWillis would like her to come in for a follow up appt. She is getting Tysabri for her MS and she has missed last few appt. She verbalized understanding and appreciation for call. She will try and reach out to her daughter.   Will wait to hear from pt to schedule appt. Can address rx Aimovig at appt

## 2017-05-25 ENCOUNTER — Other Ambulatory Visit: Payer: Self-pay | Admitting: Neurology

## 2017-06-03 ENCOUNTER — Telehealth: Payer: Self-pay | Admitting: Neurology

## 2017-06-03 NOTE — Telephone Encounter (Signed)
Debbie with [employer] is calling to find out if the patient has discontinued Tysabri.

## 2017-06-03 NOTE — Telephone Encounter (Signed)
I called back and spoke with Darl Pikes. I made her aware that patient's last infusion was 12/04/2016 and that she has cancelled or no showed her last few infusions and we have not been able to reach her and she will not return calls.

## 2017-06-04 ENCOUNTER — Telehealth: Payer: Self-pay | Admitting: *Deleted

## 2017-06-04 NOTE — Telephone Encounter (Signed)
Faxed completed/signed Tysabri discontinuation questionnaire back to [facility] program with [employer].  Per CW,MD: "Unable to contact patient, has missed RV appt" Fax: [phone]. Received fax confirmation.  Also received fax notification from [facility] program that [facility] day surgery also has been notified that patient no longer authorized to receive tysabri therapy.

## 2017-06-11 ENCOUNTER — Other Ambulatory Visit: Payer: Self-pay | Admitting: Neurology

## 2017-06-22 ENCOUNTER — Other Ambulatory Visit: Payer: Self-pay | Admitting: Neurology

## 2017-07-09 ENCOUNTER — Other Ambulatory Visit: Payer: Self-pay | Admitting: Neurology

## 2017-07-11 ENCOUNTER — Other Ambulatory Visit: Payer: Self-pay | Admitting: Neurology

## 2017-07-30 ENCOUNTER — Other Ambulatory Visit: Payer: Self-pay | Admitting: Neurology

## 2017-08-16 ENCOUNTER — Encounter: Payer: Self-pay | Admitting: *Deleted
